# Patient Record
Sex: Female | Born: 1985 | Race: White | Hispanic: No | Marital: Married | Smoking: Current some day smoker
Health system: Western US, Academic
[De-identification: ages and names within clinical notes are randomized; demographics above are authoritative.]

## PROBLEM LIST (undated history)

## (undated) DIAGNOSIS — G8929 Other chronic pain: Secondary | ICD-10-CM

## (undated) DIAGNOSIS — J309 Allergic rhinitis, unspecified: Secondary | ICD-10-CM

## (undated) DIAGNOSIS — F319 Bipolar disorder, unspecified: Secondary | ICD-10-CM

## (undated) DIAGNOSIS — M545 Low back pain, unspecified: Secondary | ICD-10-CM

## (undated) HISTORY — DX: Low back pain, unspecified: M54.50

## (undated) HISTORY — DX: Allergic rhinitis, unspecified: J30.9

## (undated) HISTORY — PX: NO PAST SURGERIES: SHX2092

## (undated) HISTORY — DX: Other chronic pain: G89.29

## (undated) HISTORY — DX: Low back pain: M54.5

## (undated) HISTORY — DX: Bipolar disorder, unspecified (CMS-HCC): F31.9

## (undated) MED ORDER — FLUTICASONE PROPIONATE 50 MCG/ACT NA SUSP
NASAL | 2 refills | Status: AC
Start: 2021-08-29 — End: ?

## (undated) MED ORDER — HUMIRA (2 PEN) 40 MG/0.4ML SC PNKT
PEN_INJECTOR | SUBCUTANEOUS | Status: AC
Start: 2023-02-25 — End: ?

## (undated) MED ORDER — NAPROXEN 375 MG OR TABS
375.00 mg | ORAL_TABLET | Freq: Two times a day (BID) | ORAL | 0 refills | Status: AC
Start: 2020-03-24 — End: ?

## (undated) MED ORDER — NAPROXEN 375 MG OR TABS
375.00 mg | ORAL_TABLET | Freq: Two times a day (BID) | ORAL | 0 refills | Status: AC
Start: 2021-06-17 — End: ?

## (undated) MED ORDER — GABAPENTIN 300 MG OR CAPS
300.0000 mg | ORAL_CAPSULE | Freq: Three times a day (TID) | ORAL | Status: AC | PRN
Start: 2020-10-26 — End: ?

## (undated) MED ORDER — ADALIMUMAB 40 MG/0.4ML SC PNKT (CUSTOM)
40.0000 mg | PEN_INJECTOR | SUBCUTANEOUS | 3 refills | Status: AC
Start: 2022-10-03 — End: ?

## (undated) MED ORDER — NAPROXEN 375 MG OR TABS
375.00 mg | ORAL_TABLET | Freq: Two times a day (BID) | ORAL | 0 refills | Status: AC
Start: 2020-06-14 — End: ?

## (undated) MED ORDER — GABAPENTIN 300 MG OR CAPS
300.00 mg | ORAL_CAPSULE | Freq: Three times a day (TID) | ORAL | 0 refills | Status: AC | PRN
Start: 2020-06-17 — End: ?

## (undated) MED ORDER — GABAPENTIN 300 MG OR CAPS
300.00 mg | ORAL_CAPSULE | Freq: Three times a day (TID) | ORAL | 0 refills | Status: AC
Start: 2020-03-24 — End: ?

## (undated) MED ORDER — NAPROXEN 375 MG OR TABS
ORAL_TABLET | ORAL | 0 refills | Status: AC
Start: 2021-09-20 — End: ?

## (undated) MED ORDER — NAPROXEN 375 MG OR TABS
375.00 mg | ORAL_TABLET | Freq: Two times a day (BID) | ORAL | 0 refills | Status: AC
Start: 2020-06-17 — End: ?

## (undated) MED ORDER — DICLOFENAC SODIUM 75 MG OR TBEC
75.0000 mg | DELAYED_RELEASE_TABLET | Freq: Two times a day (BID) | ORAL | 3 refills | Status: AC
Start: 2022-10-12 — End: ?

## (undated) MED ORDER — NAPROXEN 375 MG OR TABS
375.0000 mg | ORAL_TABLET | Freq: Two times a day (BID) | ORAL | Status: AC
Start: 2020-10-26 — End: ?

---

## 2015-08-09 IMAGING — CR CHEST 2 VWS PA LAT
1 series · 2 of 2 positions shown · non-contrast
Comparison: None

Chest 2 views
HISTORY: Nonspecific reaction to tuberculin skin test without active tuberculosis

[Series 1: view not recorded · 0.17mm/px · 2 of 2 slices shown]
[im 1/2]
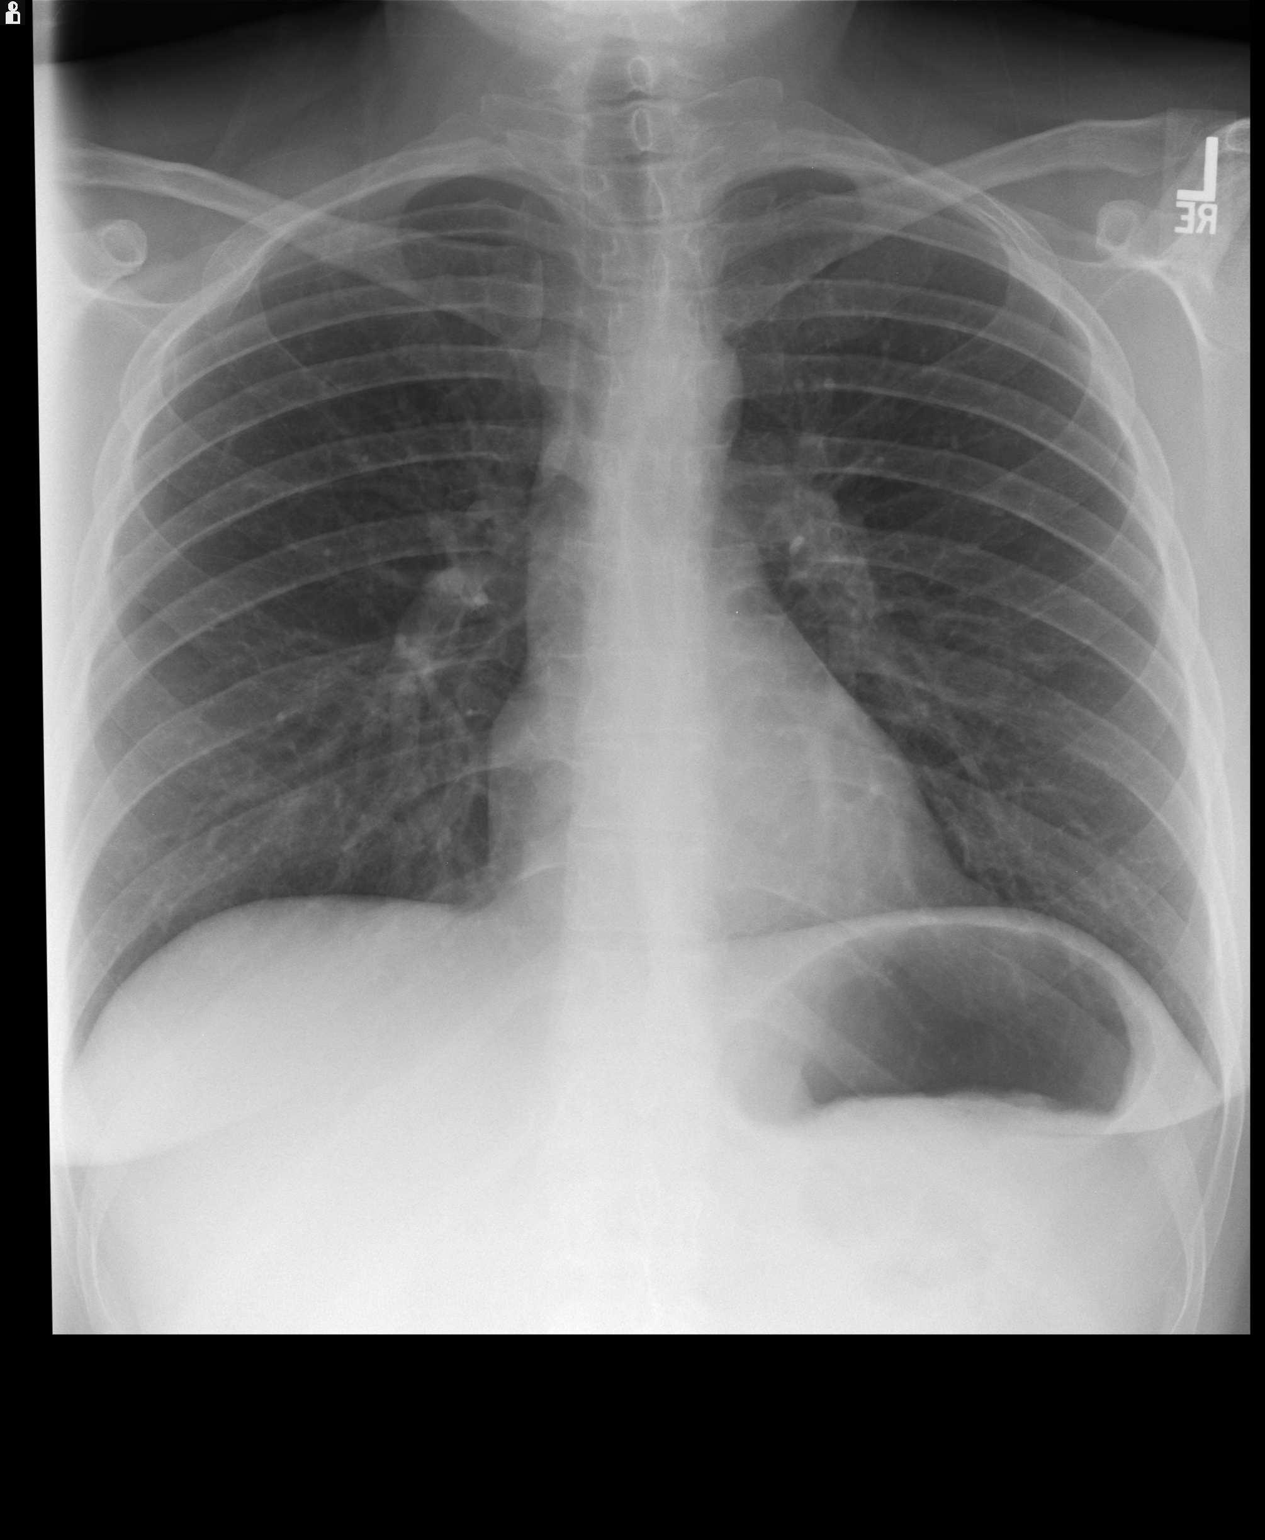
[im 2/2]
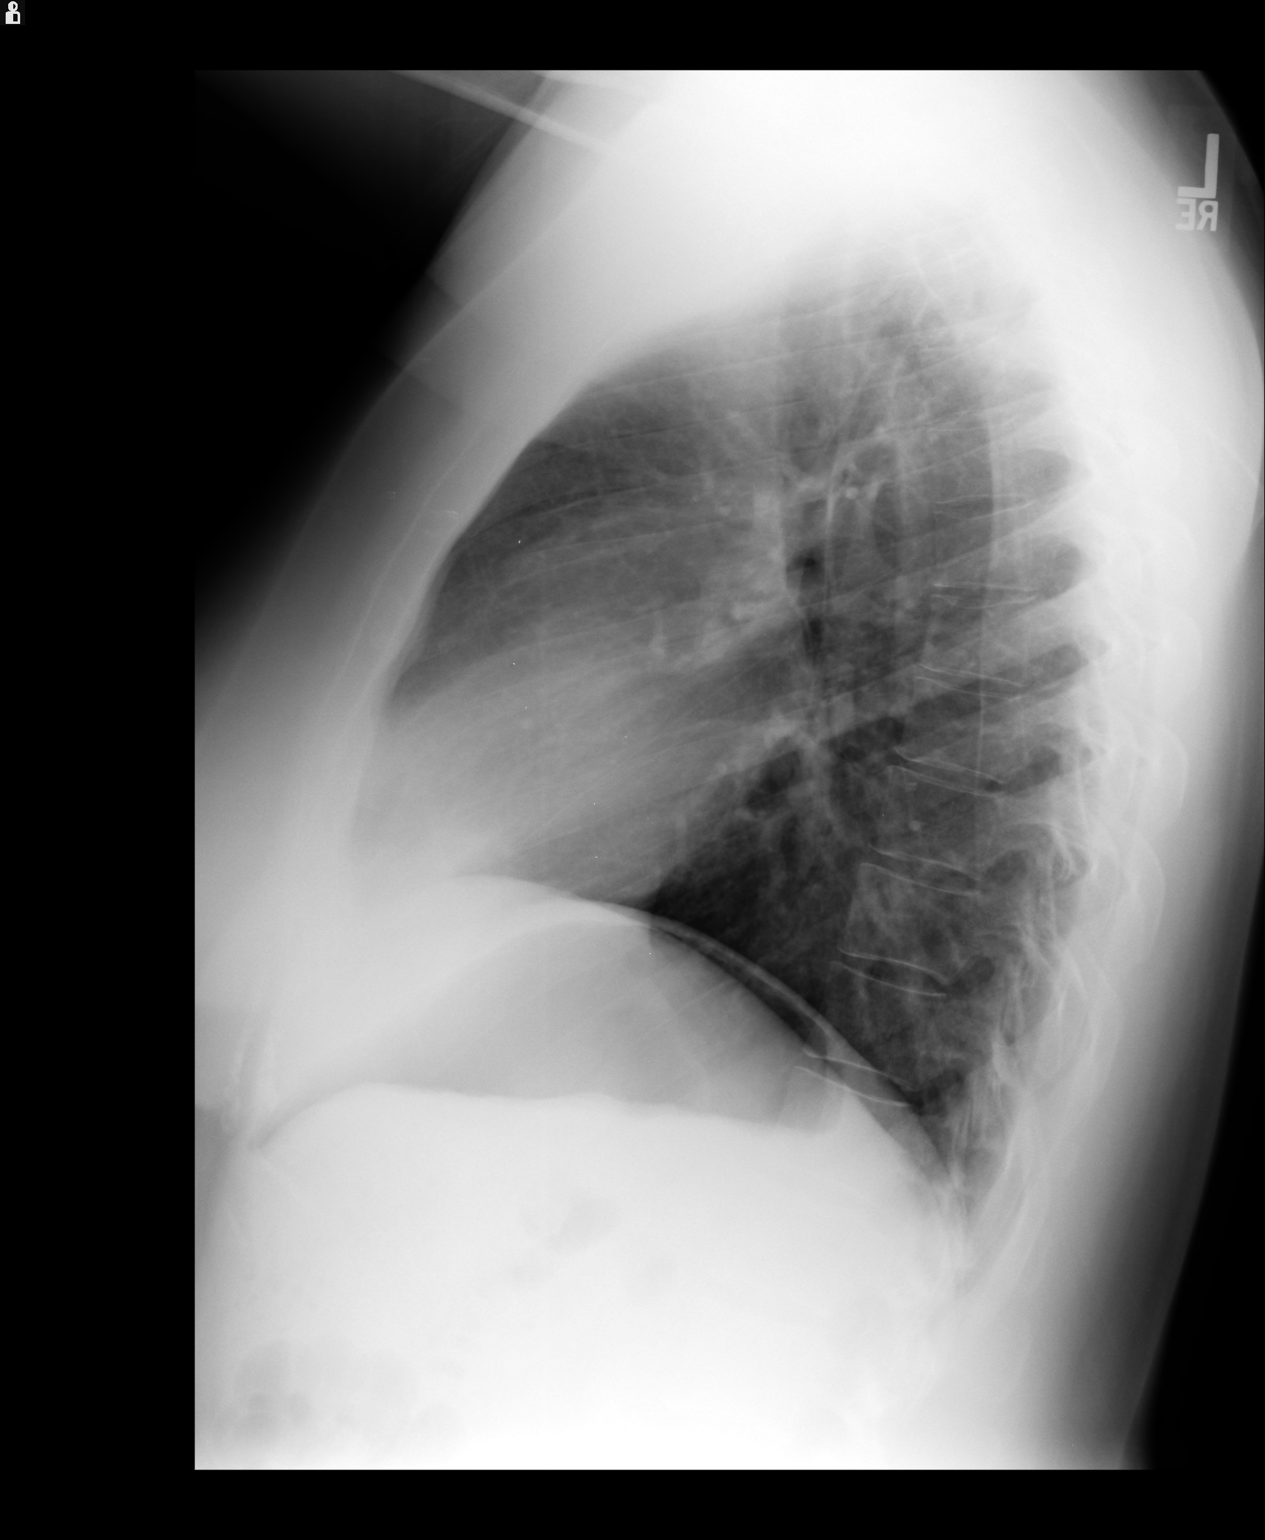

[2 of 2 positions shown; findings below may reference images not displayed]

FINDINGS: Cardiac size and pulmonary vascularity are normal.  The mediastinal silhouette is unremarkable.  The lungs are normal, and there is no pleural fluid.
IMPRESSION: Normal chest.

## 2018-03-26 ENCOUNTER — Encounter: Payer: Self-pay | Admitting: Hospital

## 2018-10-30 ENCOUNTER — Encounter (INDEPENDENT_AMBULATORY_CARE_PROVIDER_SITE_OTHER): Payer: Self-pay | Admitting: Sports Medicine

## 2018-10-30 ENCOUNTER — Encounter

## 2018-10-30 ENCOUNTER — Ambulatory Visit (INDEPENDENT_AMBULATORY_CARE_PROVIDER_SITE_OTHER): Payer: TRICARE Prime—HMO | Admitting: Sports Medicine

## 2018-10-30 VITALS — BP 115/74 | HR 104 | Temp 98.1°F | Wt 177.0 lb

## 2018-10-30 DIAGNOSIS — Z1331 Encounter for screening for depression: Secondary | ICD-10-CM

## 2018-10-30 DIAGNOSIS — F319 Bipolar disorder, unspecified: Secondary | ICD-10-CM

## 2018-10-30 DIAGNOSIS — G8929 Other chronic pain: Secondary | ICD-10-CM | POA: Insufficient documentation

## 2018-10-30 DIAGNOSIS — Z1339 Encounter for screening examination for other mental health and behavioral disorders: Secondary | ICD-10-CM

## 2018-10-30 DIAGNOSIS — M545 Low back pain, unspecified: Secondary | ICD-10-CM

## 2018-10-30 DIAGNOSIS — Z7689 Persons encountering health services in other specified circumstances: Secondary | ICD-10-CM

## 2018-10-30 DIAGNOSIS — Z1389 Encounter for screening for other disorder: Secondary | ICD-10-CM

## 2018-10-30 DIAGNOSIS — J309 Allergic rhinitis, unspecified: Secondary | ICD-10-CM | POA: Insufficient documentation

## 2018-10-30 DIAGNOSIS — Z30011 Encounter for initial prescription of contraceptive pills: Secondary | ICD-10-CM

## 2018-10-30 DIAGNOSIS — F39 Unspecified mood [affective] disorder: Secondary | ICD-10-CM | POA: Insufficient documentation

## 2018-10-30 MED ORDER — MELOXICAM 7.5 MG OR TABS
7.5000 mg | ORAL_TABLET | Freq: Every day | ORAL | 1 refills | Status: DC
Start: 2018-10-30 — End: 2019-01-23

## 2018-10-30 MED ORDER — FLUTICASONE PROPIONATE 50 MCG/ACT NA SUSP
1.0000 | Freq: Every day | NASAL | 3 refills | Status: DC
Start: 2018-10-30 — End: 2020-10-27

## 2018-10-30 MED ORDER — NORETHINDRONE (CONTRACEPTIVE) 0.35 MG OR TABS
1.0000 | ORAL_TABLET | Freq: Every day | ORAL | 3 refills | Status: DC
Start: 2018-10-30 — End: 2018-11-27

## 2018-10-30 NOTE — Progress Notes (Signed)
Family Medicine Clinic Note    Chief Complaint   Patient presents with   . Establish Care     new/allergy medication   . Psychiatric Problem     discuss bipolar medication/pain mgmt was stopped after pregnancy   . Depression     discuss medication/pain mgmt was stopped after pregnancy   . Back Pain     took husbands Mobic because of severe pain but usually Tylenol   . Headaches     SUBJECTIVE: Angelica Wade is a 33 year old female who presents to clinic for:    #Establish care:  Moved here in April from [place]. Husband is in [employer]  Was seen at [facility] for her prenatal care and delivery of her daughter (who is now 21 months old)    #Mood symptoms:  Has been diagnosed with bipolar and depression  Was on Wellbutrin  Stopped taking medications when she was pregnant with her daughter  Previously was seeing a psychiatrist for this  Has been taking medication since she was 49 years old  Mom and multiple family members have bipolar    #Back pain:  Whole back  Feels like it is "on fire"  Sometimes wraps around side, L>R  Worse with walking  Tried PT, chiropractor without relief  Was seeing pain management. Was treating with Mobic and gabapentin 100 mg, which was working for her.     #Contraception:  Previously on depo provera in the past  Regular periods every month  Does have migraines with aura  No history of blood clots    HCM:  Pap: 1 year ago  Flu: declines  Tetanus: 2019    ROS: As per HPI, otherwise  General: Denies fevers, chills, unintentional weight loss  Kidney: Denies changes in urination, blood in urine, pain with urination  Lungs: Denies cough, shortness of breath, bloody cough  Stomach: Denies bloody or black stools, abdominal pain, vomiting, diarrhea  Neurologic: Denies weakness, tingling, problems walking, fainting.  Vascular: + leg pain with walking, Denies painful hands/digits  Oral: Denies bleeding from gums, open sores  Heart: Denies chest pain, irregular heartbeat  Psychiatric: + depression, behavioral  changes  Muscle: Denies joint pain, leg swelling  Ear/Nose/throat: Denies nose bleeds  Skin: Denies rashes  Blood: Denies bleeding, bruising  Endocrine: Denies feeling hot or cold    Current Outpatient Medications   Medication Sig Dispense Refill   . fluticasone propionate (FLONASE) 50 MCG/ACT nasal spray Spray 1 spray into each nostril daily. 1 bottle 3   . meloxicam (MOBIC) 7.5 MG tablet Take 1 tablet (7.5 mg) by mouth daily. 30 tablet 1   . norethindrone (ORTHO MICRONOR) 0.35 MG tablet Take 1 tablet by mouth daily. 84 tablet 3     No current facility-administered medications for this visit.      Allergies   Allergen Reactions   . Shellfish-Derived Products Swelling       Patient Active Problem List   Diagnosis   . Bipolar affective disorder (CMS-HCC)   . Chronic low back pain   . Allergic rhinitis     Past Medical History:   Diagnosis Date   . Allergic rhinitis    . Bipolar affective disorder (CMS-HCC)    . Chronic low back pain      Past Surgical History:   Procedure Laterality Date   . NO PAST SURGERIES       Social History     Tobacco Use   . Smoking status: Current Some  Day Smoker     Types: Cigarettes, Electronic Cigarette (Smokeless)   . Smokeless tobacco: Current User   . Tobacco comment: former cigarettes, vapes currently   Substance Use Topics   . Alcohol use: Yes     Frequency: Monthly or less   . Drug use: Never       OBJECTIVE:  BP 115/74 (BP Location: Left arm, BP Patient Position: Sitting, BP cuff size: Regular)   Pulse 104   Temp 98.1 F (36.7 C) (Oral)   Wt 80.3 kg (177 lb)   LMP 10/16/2018   SpO2 96%  There is no height or weight on file to calculate BMI.    Gen: Alert, no acute distress, non-toxic appearing  Lungs: Clear to auscultation bilaterally. No wheeze/rales/rhonchi   CV: Regular rate & rhythm, normal S1, S2. No murmurs  Back: TTP along mid-thoracic and lower lumbar spine, TTP along paraspinal muscles throughout, L>R.       ASSESSMENT/PLAN:  Lundynn was seen today for establish care,  psychiatric problem, depression, back pain and headaches.    Diagnoses and all orders for this visit:    Encounter to establish care  Health history, medications, allergies reviewed with patient and entered into Epic. ROI signed to obtain records from previous health care.     Bipolar affective disorder, remission status unspecified (CMS-HCC)  Not currently on any medication, has not been on for >1 year. Mood stable currently. Will place referral to psychiatry  -     Psychiatry Clinic    Chronic bilateral low back pain without sciatica  Reports no significant improvement with PT or chiropractor in the past. Will refill her Mobic for now, also discussed acupuncture and OMT as options, patient would like to try OMT  -     meloxicam (MOBIC) 7.5 MG tablet; Take 1 tablet (7.5 mg) by mouth daily.  -     Consult Referral to [facility]    Encounter for initial prescription of contraceptive pills  Reported history of migraine with aura, also smoker, so combined OCPs contraindicated. She is thinking about Nexplanon, but not for sure yet. Does want to start something today, so will give progesterone only pills. Provided with [email or web address] for more information on birth control options. She will let me know if she wants to pursue the Nexplanon or other options.   -     norethindrone (ORTHO MICRONOR) 0.35 MG tablet; Take 1 tablet by mouth daily.    Allergic rhinitis, unspecified seasonality, unspecified trigger  -     fluticasone propionate (FLONASE) 50 MCG/ACT nasal spray; Spray 1 spray into each nostril daily.    Screened negative for alcohol use    Screened negative for drug use    Screened negative for depression      Follow up  Return in about 3 months (around 01/28/2019), or if symptoms worsen or fail to improve.    Medication Review:  Medications reviewed with patient and medication list reconciled.  Over the counter medications, herbal therapies and supplements reviewed.  Patient's understanding and  response to medications assessed.   Barriers to medications assessed and addressed.   Risks, benefits, alternatives to medications reviewed.    No barriers to learning, verbalizes understanding of teaching and instructions.    Junita Push, MD  [facility]  Pager [phone]

## 2018-10-30 NOTE — Interdisciplinary (Signed)
Pre-visit chart review and huddle completed with staff and physician.    Outstanding labs, imaging and consults reviewed and identified.    Health maintanence issues identified and addressed:    Health Maintenance   Topic Date Due   . CERVICAL CANCER SCREENING  [birth year]1987   . PHQ2 depression screen  05/05/2005   . Tetanus (1 - Tdap) 05/05/2006   . INFLUENZA VACCINE  04/03/2018   . Polio Vaccine  Aged Out   . HPV Vaccine <= 26 Yrs  Aged Out   . Meningococcal MCV4 Vaccine  Aged Out

## 2018-10-30 NOTE — Patient Instructions (Signed)
[email or web address] for information about birth control options.

## 2018-11-12 ENCOUNTER — Encounter (INDEPENDENT_AMBULATORY_CARE_PROVIDER_SITE_OTHER): Payer: Self-pay | Admitting: Sports Medicine

## 2018-11-12 ENCOUNTER — Encounter (INDEPENDENT_AMBULATORY_CARE_PROVIDER_SITE_OTHER): Payer: Self-pay | Admitting: Internal Medicine

## 2018-11-12 DIAGNOSIS — Z0000 Encounter for general adult medical examination without abnormal findings: Secondary | ICD-10-CM

## 2018-11-12 DIAGNOSIS — Z30017 Encounter for initial prescription of implantable subdermal contraceptive: Principal | ICD-10-CM

## 2018-11-13 NOTE — Telephone Encounter (Signed)
Please advise 

## 2018-11-13 NOTE — Telephone Encounter (Signed)
From: [name]  To: Laurence Compton, MD  Sent: 11/12/2018 10:53 PM PDT  Subject: 20-Other    Hi so my husband and I have talked about it and we I mean me want to do the arm birth control . Please give me a call at [phone] . Or here not sure how that works .

## 2018-11-17 ENCOUNTER — Encounter (INDEPENDENT_AMBULATORY_CARE_PROVIDER_SITE_OTHER): Payer: Self-pay | Admitting: Sports Medicine

## 2018-11-18 ENCOUNTER — Encounter (INDEPENDENT_AMBULATORY_CARE_PROVIDER_SITE_OTHER): Payer: Self-pay | Admitting: Sports Medicine

## 2018-11-18 NOTE — Telephone Encounter (Signed)
From: [name]  To: Laurence Compton, MD  Sent: 11/17/2018 3:54 PM PDT  Subject: 20-Other    Hi I'm just wondering when I will be getting my pain management . They only gave me 30 pills and that won't last long . I really need my lower back looked at and hip it's been pretty bad lately to the point I can't walk or nothing .

## 2018-11-19 ENCOUNTER — Encounter (INDEPENDENT_AMBULATORY_CARE_PROVIDER_SITE_OTHER): Payer: Self-pay | Admitting: Sports Medicine

## 2018-11-19 ENCOUNTER — Telehealth (INDEPENDENT_AMBULATORY_CARE_PROVIDER_SITE_OTHER): Payer: Self-pay | Admitting: Sports Medicine

## 2018-11-19 NOTE — Telephone Encounter (Signed)
Video visit scheduled for 11/20/2018 @ 10:20.

## 2018-11-19 NOTE — Telephone Encounter (Signed)
Please advise 

## 2018-11-19 NOTE — Telephone Encounter (Signed)
From: [name]  To: Laurence Compton, MD  Sent: 11/19/2018 7:50 AM PDT  Subject: 20-Other    Hi I also wanted to talk to my doctor about smoking and maybe go the alternative way I have not tried like gum or mints or anything I have tried patches but didn't work I have tried talking to someone I have also tried quiting cold [place] nothing has helped . I don't smoke cigarettes but I do vape which is just as bad

## 2018-11-19 NOTE — Telephone Encounter (Signed)
Sounds like she would be great for a video visit. thanks

## 2018-11-20 ENCOUNTER — Encounter (INDEPENDENT_AMBULATORY_CARE_PROVIDER_SITE_OTHER): Payer: Self-pay | Admitting: Sports Medicine

## 2018-11-20 ENCOUNTER — Other Ambulatory Visit: Payer: Self-pay

## 2018-11-20 ENCOUNTER — Telehealth (INDEPENDENT_AMBULATORY_CARE_PROVIDER_SITE_OTHER): Payer: TRICARE Prime—HMO | Admitting: Sports Medicine

## 2018-11-20 DIAGNOSIS — Z720 Tobacco use: Principal | ICD-10-CM

## 2018-11-20 DIAGNOSIS — Z716 Tobacco abuse counseling: Secondary | ICD-10-CM

## 2018-11-20 DIAGNOSIS — G8929 Other chronic pain: Secondary | ICD-10-CM

## 2018-11-20 DIAGNOSIS — M5441 Lumbago with sciatica, right side: Secondary | ICD-10-CM

## 2018-11-20 DIAGNOSIS — M5442 Lumbago with sciatica, left side: Secondary | ICD-10-CM

## 2018-11-20 MED ORDER — VARENICLINE TARTRATE 0.5 MG X 11 & 1 MG X 42 OR MISC
ORAL | 0 refills | Status: DC
Start: 2018-11-20 — End: 2018-11-21

## 2018-11-20 MED ORDER — GABAPENTIN 100 MG OR CAPS
100.0000 mg | ORAL_CAPSULE | Freq: Three times a day (TID) | ORAL | 0 refills | Status: DC | PRN
Start: 2018-11-20 — End: 2018-11-21

## 2018-11-20 NOTE — Patient Instructions (Signed)
For Chantix:  Pick a quit date in 3-4 weeks  Start with 0.5 mg daily for 3 days, then 0.5 mg twice per day for 3 days, then take 1 mg twice daily

## 2018-11-20 NOTE — Progress Notes (Signed)
FAMILY MEDICINE TELEMEDICINE PROGRESS NOTE    CC:    Chief Complaint   Patient presents with   . Nicotine Dependence     SUBJECTIVE:    Angelica Wade is a 33 year old female who is being seen for the following issues:    ---------------------(data below generated by Laurence Compton, MD)--------------------    Patient Verification & Telemedicine Consent:    I am proceeding with this evaluation at the direct request of the patient.  I have verified this is the correct patient and have obtained verbal consent and written consent from the patient/ surrogate to perform this voluntary telemedicine evaluation (including obtaining history, performing examination and reviewing data provided by the patient).   The patient/ surrogate has the right to refuse this evaluation.  I have explained risks (including potential loss of confidentiality), benefits, alternatives, and the potential need for subsequent face to face care. Patient/ surrogate understands that there is a risk of medical inaccuracies given that our recommendations will be made based on reported data (and we must therefore assume this information is accurate).  Knowing that there is a risk that this information is not reported accurately, and that the telemedicine video, audio, or data feed may be incomplete, the patient agrees to proceed with evaluation and holds [place] harmless knowing these risks. In this evaluation, we will be providing recommendations only.  The ultimate decision to follow, or not follow, these recommendations will be left to the bedside treating/ requesting practitioner.  The patient/ surrogate has been notified that other healthcare professionals (including students, residents and Engineer, maintenance) may be involved in this audio-video evaluation.   All laws concerning confidentiality and patient access to medical records and copies of medical records apply to telemedicine.  The patient/ surrogate has received the [place] Notice of Privacy  Practices.  I have reviewed this above verification and consent paragraph with the patient/ surrogate.  If the patient is not capacitated to understand the above, and no surrogate is available, since this is not an emergency evaluation, the visit will be rescheduled until such time that the patient can consent, or the surrogate is available to consent.    Demographics:   Medical Record #: [id number]   Date: November 20, 2018   Patient Name: Angelica Wade   DOB: April 12, 1986  Age: 33 year old  Sex: female  Location: Home address on file      Evaluator(s):   Angelica Wade was evaluated by me today.    Clinic Location:  [facility]  [facility]  [address]  [place] [address]    20 minutes of what became a 20 minute appointment was spent face to face with patient/cargiver in coordinating care and counseling for the below issues.      HPI by Problem:     1) Smoking cessation:  Vaping currently, no cigarettes  Has tried Chantix before, did not have success with this  Has also tried patches, has not tried gum or lozenges  Also done meditation  Tried cold [place], but made things worse  Interested in other options    2) Back pain:  Radiates into both hips  Feels like sciatic nerve  Hurts with any ambulation  Has been taking Mobic, but doesn't feel like it has been working as well for her  Previously on gabapentin in the past, thinks 100 mg, thinks this did work well for her in the past  Per last  clinic note:  #Back pain:  Whole back  Feels like it is "on fire"  Sometimes wraps around side, L>R  Worse with walking  Tried PT, chiropractor without relief  Was seeing pain management. Was treating with Mobic and gabapentin 100 mg, which was working for her.       Review of Systems:   As per HPI, otherwise denies saddle anesthesia, bowel/bladder issues, numbness, tingling, weakness, fevers, chills, cough, shortness of breath      Patient Active Problem List   Diagnosis   .  Bipolar affective disorder (CMS-HCC)   . Chronic low back pain   . Allergic rhinitis       Outpatient Medications Prior to Visit   Medication Sig Dispense Refill   . fluticasone propionate (FLONASE) 50 MCG/ACT nasal spray Spray 1 spray into each nostril daily. 1 bottle 3   . meloxicam (MOBIC) 7.5 MG tablet Take 1 tablet (7.5 mg) by mouth daily. 30 tablet 1   . norethindrone (ORTHO MICRONOR) 0.35 MG tablet Take 1 tablet by mouth daily. 84 tablet 3     No facility-administered medications prior to visit.        OBJECTIVE:  Physical Exam:   GENERAL: NAD, comfortable, WDWN  RESP:  Unlabored breathing  PSYCH: normal affect  NEURO:  alert & oriented  SKIN: Intact, no rashes or erythema, no open wounds       ASSESSMENT & PLAN:  Easton Gales is a 33 year old female was seen today for:  Fonnie was seen today for nicotine dependence.    Diagnoses and all orders for this visit:    Tobacco use  -     Smoker's Cessation - Quit Line  -     varenicline (CHANTIX STARTER PAK) 0.5 MG X 11 & 1 MG X 42 tablet; Take one 0.5mg  tablet by mouth once daily for 3 days, then one 0.5mg  tab twice daily for 4 days, then one 1mg  tab twice daily    Encounter for smoking cessation counseling  Has tried multiple options for quitting in the past, including patches, meditation, and Chantix.  She does report the Chantix was not successful, but reports trying to quit smoking at the same time as she started her Chantix medication and only took this for a couple of weeks.  Discuss further options with patient for smoking cessation, including trying the Chantix again with proper treatment protocol verses nicotine gum verses Wellbutrin versus other options.  She is interested in trying Chantix again.  Recommend she pick a quit date in 3-4 weeks to allow the medication to build up in her system and reach a steady state prior to attempting to quit smoking.  Also discussed that this can take three months or longer of treatment with Chantix to have its most  beneficial effect.  -     Smoker's Cessation - Quit Line  -     varenicline (CHANTIX STARTER PAK) 0.5 MG X 11 & 1 MG X 42 tablet; Take one 0.5mg  tablet by mouth once daily for 3 days, then one 0.5mg  tab twice daily for 4 days, then one 1mg  tab twice daily    Chronic bilateral low back pain with bilateral sciatica  Has had benefit with gabapentin in the past, will restart gabapentin 100 mg t.i.d. p.r.n. for back pain.  Will also place referral to pain clinic at patient's request for further management of her chronic low back pain.  -     Pain Clinic  -  gabapentin (NEURONTIN) 100 MG capsule; Take 1 capsule (100 mg) by mouth 3 times daily as needed (Back pain).      Health Maintenance   Topic Date Due   . IMM pneumococcal 19-64 Medium Risk PPSV23 only (1 of 1 - PPSV23) 05/05/2006   . PHQ2 depression screen  10/31/2019   . CERVICAL CANCER SCREENING  09/03/2022   . Tetanus (3 - Td) 03/03/2028   . INFLUENZA VACCINE  Addressed   . Polio Vaccine  Aged Out   . HPV Vaccine <= 26 Yrs  Aged Out   . Meningococcal MCV4 Vaccine  Aged Out       Return if symptoms worsen or fail to improve.    Patient Instructions   For Chantix:  Pick a quit date in 3-4 weeks  Start with 0.5 mg daily for 3 days, then 0.5 mg twice per day for 3 days, then take 1 mg twice daily      Laurence Compton, MD      Plan discussed with pt including risks/benefits/alternatives including watchful waiting.  Informed pt of 24/7 on call MD.  ED if acutely worsening after hours.  Pt verbalized understanding.    Medications reviewed with patient and medication list reconciled.  Over the counter medications, herbal therapies and supplements reviewed.  Patient's understanding and response to medications assessed.    Barriers to medications assessed and addressed.  Risks, benefits, alternatives to medications reviewed.

## 2018-11-20 NOTE — Telephone Encounter (Signed)
done

## 2018-11-21 ENCOUNTER — Encounter (INDEPENDENT_AMBULATORY_CARE_PROVIDER_SITE_OTHER): Payer: Self-pay | Admitting: Sports Medicine

## 2018-11-21 DIAGNOSIS — G8929 Other chronic pain: Secondary | ICD-10-CM

## 2018-11-21 DIAGNOSIS — Z716 Tobacco abuse counseling: Secondary | ICD-10-CM

## 2018-11-21 DIAGNOSIS — Z720 Tobacco use: Secondary | ICD-10-CM

## 2018-11-21 MED ORDER — GABAPENTIN 100 MG OR CAPS
100.0000 mg | ORAL_CAPSULE | Freq: Three times a day (TID) | ORAL | 0 refills | Status: DC | PRN
Start: 2018-11-21 — End: 2020-02-18

## 2018-11-21 MED ORDER — VARENICLINE TARTRATE 0.5 MG X 11 & 1 MG X 42 OR MISC
ORAL | 0 refills | Status: DC
Start: 2018-11-21 — End: 2019-10-08

## 2018-11-21 NOTE — Telephone Encounter (Signed)
Spoke to patient in which she verbalized understanding and will pick up asap. She appreciated the urgent response.

## 2018-11-21 NOTE — Telephone Encounter (Signed)
Please advise 

## 2018-11-21 NOTE — Telephone Encounter (Signed)
General Inquiry     Who is calling: Incoming call from patient    Reason for this call: Patient calling in regarding prescriptions for varenicline (CHANTIX STARTER PAK) 0.5 MG X 11 & 1 MG X 42 tablet and gabapentin (NEURONTIN) 100 MG capsule. Patient called in due to spouse going in to pick up medication and the pharmacy stated that it was never sent.    Agent called into the pharmacy at [phone] and spoke with the pharmacist who states that they never received prescription, but that have been having issues with their electronic faxes. Pharmacist states that they are not able to take verbals and patient will need a hard copy prescription. Patient requesting ASAP.      Action required by office: Please expedite request and Please contact caller     Duplicate encounter? No previous documentation found on this issue.     Best way to contact: [phone]  Alternative: n/a    Inquiry has been read verbatim to this caller. Verbalizes satisfaction and confirms the above is accurate: yes    Has been advised this message will be transmitted to office and can expect a response within the next 24-72 hours.    Encounter created by Care Assist MA.

## 2018-11-24 NOTE — Progress Notes (Deleted)
Outpatient Psychiatry Intake Note  Patient Access Team-Based Health [phone])     Patient Name: Angelica Wade  Age: 33 year old  Date of Service: 12/02/2018    The patient was informed about confidentiality and limits to confidentiality within a therapeutic relationship. The patient acknowledged understanding of this information and consented to the session.    Referral Source: Patient was referred to General Psychiatry services by Dr. Donzetta Matters for evaluation and management of bipolar affective disorder.    Sources of Information: Patient, patient's chart.    Chief Complaint: No chief complaint on file.    History of Presenting Problems: ***  Per referring note, patient has prior diagnosis of Bipolar Disorder, which psychiatric medication management of symptoms since age 25, though d/ced her Wellbutrin in the context of pregnancy with her daughter. Patient gave birth ***. Patient was followed by psychiatrist prior to moving to [facility] in April.      Psychiatric Symptoms Review:  Sleep: {Sleep:21587}  Anxiety: {Anxiety:21588}  Mood: {Mood:21589}  Current SI/HI: {Current SI/HI:21590}  Mania: {PNSQZ:83462}  PTSD: {TVIF:12527}  OCD: {HSJ:29090}  AH/VH: {AH/VH:21597}    Medical Systems Review:  Endorsed: {MedSystemsRev:21598}    Denied: {MedSystemsRev:21598}    Suicide Risk Assessment: Comprehensive suicide risk assessment was performed and patient was assessed to be {LOW2:12345} acute risk for self harm. Modifiable risk factors include {Modifiable Risk Factors:21599}. Non-modifiable risk factors include {Non-Modifiable Risk Factors:21600}. The patient also has protective factors of {Protective Factors:21601}.    Assessment Results:  No flowsheet data found.   No flowsheet data found.    Psychiatric History:     Past Diagnoses: ***  Past Self-Harm: ***  Past Suicidal Ideation: ***  Past Suicide Attempts: ***  Past Psychiatric Hospitalizations: ***  Outpatient Psychological Treatment: ***   Outpatient Psychiatric  Treatment: ***  Psychiatric Medication Trials:    Previous Medications: ***   Current Medications: ***  Psychological Trauma History: ***  Family Psychiatric History: ***    Substance Use/Treatment History:  Tobacco: *** vaping, no cigarettes; recently prescribed Chantix  Alcohol: ***    Cannabis: ***  Illicit Substances: ***  Caffeine: ***  Prescription medications: ***  History of Problematic Use: ***  Treatment History: ***  Family History: ***    Medical History:  Falls: ***  LOC/Concussion: ***  Exercise: ***  Patient Active Problem List   Diagnosis   . Bipolar affective disorder (CMS-HCC)   . Chronic low back pain   . Allergic rhinitis     Past Medical History:   Diagnosis Date   . Allergic rhinitis    . Bipolar affective disorder (CMS-HCC)    . Chronic low back pain      Current Outpatient Medications   Medication Sig   . fluticasone propionate (FLONASE) 50 MCG/ACT nasal spray Spray 1 spray into each nostril daily.   Marland Kitchen gabapentin (NEURONTIN) 100 MG capsule Take 1 capsule (100 mg) by mouth 3 times daily as needed (Back pain).   . meloxicam (MOBIC) 7.5 MG tablet Take 1 tablet (7.5 mg) by mouth daily.   . norethindrone (ORTHO MICRONOR) 0.35 MG tablet Take 1 tablet by mouth daily.   . varenicline (CHANTIX STARTER PAK) 0.5 MG X 11 & 1 MG X 42 tablet Take one 0.5mg  tablet by mouth once daily for 3 days, then one 0.5mg  tab twice daily for 4 days, then one 1mg  tab twice daily     No current facility-administered medications for this visit.      Past Surgical History:  Procedure Laterality Date   . NO PAST SURGERIES       Allergies   Allergen Reactions   . Shellfish-Derived Products Swelling     Family History   Problem Relation Name Age of Onset   . Bipolar Disorder Mother     . Diabetes Mother     . Heart Disease Mother     . Alcohol/Drug Mother     . Breast Cancer Mother         Social History:  Ethnicity: ***  Born/Raised: ***  Family of Origin: ***  History of Childhood Abuse:  ***  Education/Occupational/Financial: ***  Current Relationship Status: *** Husband is in the [employer]  Sexual Orientation: ***  Gender Identity/Preferred Pronouns: ***  Children: ***  Support System/Resources: ***  Living Arrangement: ***   Hobbies/Interests: ***  Spirituality: ***    Mental Status Exam:   Physical appearance: {MDPhysical:21602::"well-nourished","appropriate appearance","grooming"}  Relatedness: {Psych relatedness:15747}  Eye contact: {Psych eye contact:15748}  Attitude: {Psych attitude:15749}  Speech quality: {Psych speech quality:15750}  Motor behavior: {Psych motor behavior:15751}  Mood: {Psych mood:15752}  Affect: {MDPsych Affect:23632}  Thought process: {Psych thought process:15754}  Thought content: {Psych thought content:15755}  Suicidal ideation: {MDSI:23633}  Homicidal ideation: {MDSI:23633}  Oriented to: {orient:21603::"person","place","time"}  Sensorium: {Psych sensorium:15760}  Attention/Concentration: {MDPsych Attn:23631}  Memory: {MDPsych Attn:23631}  Insight: {Psych insight/judgement:15764}  Judgment: {Psych insight/judgement:15764}    Assessment: ***    Patient Treatment Goals: ***    Plan/Recommendations:  ***    *** Permanent note:  Case is closed with provider. Please obtain permission from Medical Director or Program Director prior to scheduling this patient for an intake or follow up appointment with this provider.    Molli Barrows, Ph.D., Licensed Clinical Psychologist

## 2018-11-26 ENCOUNTER — Encounter (INDEPENDENT_AMBULATORY_CARE_PROVIDER_SITE_OTHER): Payer: Self-pay | Admitting: Hospital

## 2018-11-26 ENCOUNTER — Telehealth (INDEPENDENT_AMBULATORY_CARE_PROVIDER_SITE_OTHER): Payer: Self-pay

## 2018-11-26 ENCOUNTER — Telehealth (INDEPENDENT_AMBULATORY_CARE_PROVIDER_SITE_OTHER): Payer: Self-pay | Admitting: Sports Medicine

## 2018-11-26 ENCOUNTER — Encounter (INDEPENDENT_AMBULATORY_CARE_PROVIDER_SITE_OTHER): Payer: Self-pay | Admitting: Sports Medicine

## 2018-11-26 DIAGNOSIS — Z30011 Encounter for initial prescription of contraceptive pills: Principal | ICD-10-CM

## 2018-11-26 NOTE — Telephone Encounter (Signed)
From: [name]  To: Laurence Compton, MD  Sent: 11/26/2018 12:29 PM PDT  Subject: 20-Other    Hi just wondering if you got the birth control I requested in yet .

## 2018-11-26 NOTE — Telephone Encounter (Signed)
Approved but not bring patients in for procedures.   Please advise

## 2018-11-26 NOTE — Telephone Encounter (Signed)
If patient calls to  or confirm their appointment:    1. Do you have or have you had in the last 24 hours: :   Fever:  N   New cough (not chronic) : N      Shortness of breath:N  *Refer patient to PCP if YES for any flu like symptoms    2. Have you knowingly been exposed to anyone having any, some or all of the symptoms listed above?  N    Have you traveled outside of the [place] in the last 14 days? .  N    o If yes to any of these, schedule and or reschedule existing appointment 21 + days out and established patients route to:  1. [place]: P GIF ADMINISTRATION + Provider  2. [place]: P L[facility] FRONT DESK + Provider  New Patients: Document and do not route.      If no to all questions, document and close encounter.  Do not route.    Scripting if patient schedules, reschedules or keeps appointment:   [facility] takes health risks associated with respiratory illnesses, including influenza and the novel coronavirus, very seriously. As such, we are implementing strict visitor restrictions. No visitors of any age will be permitted to visit our hospitals or clinics until further notice. Request for exceptions may be made by contacting the clinic or unit in which loved ones are receiving care.

## 2018-11-26 NOTE — Telephone Encounter (Signed)
Will forward to dr galloway  Due to COVID unsure if Nexplaon is and essential appointment  Will wait his advise

## 2018-11-26 NOTE — Telephone Encounter (Signed)
Has it been ordered and in stock? Please make sure we have this in stock and then let patient know that we will have her scheduled for procedure to place it as soon as we are allowed to have patients come in for non-emergent procedures. Recommend she continue her pills for now. Thanks

## 2018-11-26 NOTE — Telephone Encounter (Signed)
Who is calling: Incoming call from patient  Insurance Coverage Verified: Active- in network  Reason for this call: Pt called requesting status on the order placed for pt's Nexplanon. Pt would like a call back from nurse or PCP regarding her birth control. Please advise      Action required by office: Please contact caller    Duplicate encounter? No previous documentation found on this issue.     Best way to contact: [phone]  Alternative: [phone]    Inquiry has been read verbatim to this caller. Verbalizes satisfaction and confirms the above is accurate: yes      Has been advised this message will be transmitted to office and can expect a response within the next 24-72 hours.

## 2018-11-27 MED ORDER — NORETHINDRONE (CONTRACEPTIVE) 0.35 MG OR TABS
1.0000 | ORAL_TABLET | Freq: Every day | ORAL | 3 refills | Status: DC
Start: 2018-11-27 — End: 2019-02-09

## 2018-11-27 NOTE — Telephone Encounter (Signed)
We have in stock , I will call her. Pt informed and will call back in 3 weeks to reschedule. Pills sent of refills

## 2018-12-01 ENCOUNTER — Telehealth (INDEPENDENT_AMBULATORY_CARE_PROVIDER_SITE_OTHER): Payer: Self-pay | Admitting: Clinical

## 2018-12-01 NOTE — Telephone Encounter (Signed)
TELEPHONE NOTE    Note entered by Molli Barrows, Ph.D., Licensed Clinical Psychologist    Left VM for patient requesting call back for best days/times to reach her to do pre-screening prior to her upcoming psychiatry intake appointment, which will be used to determine if it is appropriate to convert to video visit. Provided clinic number in VM.
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
## 2018-12-02 ENCOUNTER — Encounter (INDEPENDENT_AMBULATORY_CARE_PROVIDER_SITE_OTHER): Payer: TRICARE Prime—HMO | Admitting: Clinical

## 2018-12-02 NOTE — Telephone Encounter (Signed)
Patient returned your call, she will be available all day today 12/02/18.

## 2018-12-02 NOTE — Telephone Encounter (Signed)
TELEPHONE NOTE    Note entered by Molli Barrows, Ph.D., Licensed Clinical Psychologist    Spoke with patient and conducted pre-screening. Pt appears appropriate for video visit and has already completed such an appointment with another provider in [place]. Pt has been converted

## 2018-12-03 ENCOUNTER — Encounter (INDEPENDENT_AMBULATORY_CARE_PROVIDER_SITE_OTHER): Payer: Self-pay | Admitting: Clinical

## 2018-12-16 ENCOUNTER — Encounter (INDEPENDENT_AMBULATORY_CARE_PROVIDER_SITE_OTHER): Payer: Self-pay | Admitting: Clinical

## 2018-12-16 ENCOUNTER — Telehealth (INDEPENDENT_AMBULATORY_CARE_PROVIDER_SITE_OTHER): Payer: TRICARE Prime—HMO | Admitting: Clinical

## 2018-12-16 DIAGNOSIS — F5105 Insomnia due to other mental disorder: Secondary | ICD-10-CM

## 2018-12-16 DIAGNOSIS — F32A Depression, unspecified: Secondary | ICD-10-CM

## 2018-12-16 DIAGNOSIS — R41840 Attention and concentration deficit: Secondary | ICD-10-CM

## 2018-12-16 DIAGNOSIS — F329 Major depressive disorder, single episode, unspecified: Secondary | ICD-10-CM

## 2018-12-16 DIAGNOSIS — F419 Anxiety disorder, unspecified: Secondary | ICD-10-CM

## 2018-12-17 ENCOUNTER — Telehealth (INDEPENDENT_AMBULATORY_CARE_PROVIDER_SITE_OTHER): Payer: TRICARE Prime—HMO | Admitting: Nurse Practitioner

## 2018-12-17 DIAGNOSIS — F419 Anxiety disorder, unspecified: Secondary | ICD-10-CM | POA: Insufficient documentation

## 2018-12-17 DIAGNOSIS — F39 Unspecified mood [affective] disorder: Secondary | ICD-10-CM

## 2018-12-17 DIAGNOSIS — G4700 Insomnia, unspecified: Secondary | ICD-10-CM

## 2018-12-17 MED ORDER — TRAZODONE HCL 50 MG OR TABS
ORAL_TABLET | ORAL | 1 refills | Status: DC
Start: 2018-12-17 — End: 2020-02-18

## 2018-12-17 MED ORDER — LAMOTRIGINE 25 MG OR TABS
25.00 mg | ORAL_TABLET | Freq: Every day | ORAL | 0 refills | Status: DC
Start: 2018-12-17 — End: 2020-02-18
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
## 2018-12-30 LAB — SMOKING OR VAPING CESSATION - KICK IT ~~LOC~~

## 2019-01-23 ENCOUNTER — Encounter (INDEPENDENT_AMBULATORY_CARE_PROVIDER_SITE_OTHER): Payer: Self-pay | Admitting: Sports Medicine

## 2019-01-23 DIAGNOSIS — G8929 Other chronic pain: Secondary | ICD-10-CM

## 2019-01-23 DIAGNOSIS — M5442 Lumbago with sciatica, left side: Secondary | ICD-10-CM

## 2019-01-23 MED ORDER — MELOXICAM 15 MG OR TABS
15.0000 mg | ORAL_TABLET | Freq: Every day | ORAL | 0 refills | Status: DC
Start: 2019-01-23 — End: 2019-01-23

## 2019-01-23 MED ORDER — MELOXICAM 15 MG OR TABS
15.0000 mg | ORAL_TABLET | Freq: Every day | ORAL | 0 refills | Status: DC
Start: 2019-01-23 — End: 2019-01-24

## 2019-01-23 NOTE — Telephone Encounter (Signed)
From: Angelica Wade  To: Laurence Compton, MD  Sent: 01/23/2019 4:47 PM PDT  Subject: 1-Non Urgent Medical Advice    Hi I'm here and they are saying it didn't go through     ----- Message -----  From: Laurence Compton, MD  Sent: 01/23/19, 3:21 PM  To: Angelica Wade  Subject: RE: 1-Non Urgent Medical Advice    Sure thing, sorry about that. Scheduling information is below:    You have been referred to the Department of Pain for a clinic consult. Please call the Pain Clinic to schedule your clinic appointment in 5 to 10 days once the authorization has been completed.     Pain Department scheduling phone number: [phone]. We look forward to serving your Pain needs.    ----- Message -----   From: Angelica Wade   Sent: 01/23/2019 3:17 PM PDT   To: Laurence Compton, MD  Subject: 1-Non Urgent Medical Advice    Okay thank you do you happen to have the number for that pain clinic. I'm sorry I never got a call for pain clinic so I didn't know that the referral went through .     ----- Message -----  From: Laurence Compton, MD  Sent: 01/23/19, 3:14 PM  To: Angelica Wade  Subject: RE: 1-Non Urgent Medical Advice    [name],  I have refilled and given you some extra Mobic to have on hand, but the max dose recommended for this is 15 mg daily, which sounds like you are taking way over that. Please do not exceed 15 mg daily of this or you will run into side effects. If your pain is not well-controlled, then please call and schedule an appointment with pain clinic as I referred you to at last visit. Or you can schedule a follow up with me to [facility]dress.  Dr. Donzetta Matters    ----- Message -----   From: Angelica Wade   Sent: 01/23/2019 1:16 PM PDT    To: Laurence Compton, MD  Subject: 1-Non Urgent Medical Advice    Hi I was wondering if you could refill my mobic and up my dosage too 15 ml. I had left over from my pain management and was taking 30 ml two times a day morning and night time and it helps so much I don't find myself  hurting when I do dishes or do the laundry and at night time I don't find myself waking up and staying up because of the pain .     So if I could do 2 15 ml which is 30 ml two times a day it would help out . Thank you and have a blessed day and stay safe .

## 2019-01-23 NOTE — Telephone Encounter (Signed)
From: [name]  To: Laurence Compton, MD  Sent: 01/23/2019 1:16 PM PDT  Subject: 1-Non Urgent Medical Advice    Hi I was wondering if you could refill my mobic and up my dosage too 15 ml. I had left over from my pain management and was taking 30 ml two times a day morning and night time and it helps so much I don't find myself hurting when I do dishes or do the laundry and at night time I don't find myself waking up and staying up because of the pain .     So if I could do 2 15 ml which is 30 ml two times a day it would help out . Thank you and have a blessed day and stay safe .

## 2019-01-23 NOTE — Telephone Encounter (Signed)
From: Angelica Wade  To: Angelica Compton, MD  Sent: 01/23/2019 3:17 PM PDT  Subject: 1-Non Urgent Medical Advice    Okay thank you do you happen to have the number for that pain clinic. I'm sorry I never got a call for pain clinic so I didn't know that the referral went through .     ----- Message -----  From: Angelica Compton, MD  Sent: 01/23/19, 3:14 PM  To: Angelica Wade  Subject: RE: 1-Non Urgent Medical Advice    Angelica Wade,  I have refilled and given you some extra Mobic to have on hand, but the max dose recommended for this is 15 mg daily, which sounds like you are taking way over that. Please do not exceed 15 mg daily of this or you will run into side effects. If your pain is not well-controlled, then please call and schedule an appointment with pain clinic as I referred you to at last visit. Or you can schedule a follow up with me to [facility]dress.  Angelica Wade    ----- Message -----   From: Angelica Wade   Sent: 01/23/2019 1:16 PM PDT   To: Angelica Compton, MD  Subject: 1-Non Urgent Medical Advice    Hi I was wondering if you could refill my mobic and up my dosage too 15 ml. I had left over from my pain management and was taking 30 ml two times a day morning and night time and it helps so much I don't find myself hurting when I do dishes or do the laundry and at night time I don't find myself waking up and staying up because of the pain .     So if I could do 2 15 ml which is 30 ml two times a day it would help out . Thank you and have a blessed day and stay safe .

## 2019-01-24 MED ORDER — MELOXICAM 15 MG OR TABS
15.0000 mg | ORAL_TABLET | Freq: Every day | ORAL | 0 refills | Status: DC
Start: 2019-01-24 — End: 2020-01-26

## 2019-01-24 NOTE — Telephone Encounter (Signed)
From: [name]  To: Laurence Compton, MD  Sent: 01/23/2019 5:26 PM PDT  Subject: 1-Non Urgent Medical Advice    Hi is there anyway you could send it to the [facility]   ----- Message -----  From: Laurence Compton, MD  Sent: 01/23/19, 3:21 PM  To: [name]  Subject: RE: 1-Non Urgent Medical Advice    Sure thing, sorry about that. Scheduling information is below:    You have been referred to the Department of Pain for a clinic consult. Please call the Pain Clinic to schedule your clinic appointment in 5 to 10 days once the authorization has been completed.     Pain Department scheduling phone number: [phone]. We look forward to serving your Pain needs.    ----- Message -----   From: [name]   Sent: 01/23/2019 3:17 PM PDT   To: Laurence Compton, MD  Subject: 1-Non Urgent Medical Advice    Okay thank you do you happen to have the number for that pain clinic. I'm sorry I never got a call for pain clinic so I didn't know that the referral went through .     ----- Message -----  From: Laurence Compton, MD  Sent: 01/23/19, 3:14 PM  To: [name]  Subject: RE: 1-Non Urgent Medical Advice    [name],  I have refilled and given you some extra Mobic to have on hand, but the max dose recommended for this is 15 mg daily, which sounds like you are taking way over that. Please do not exceed 15 mg daily of this or you will run into side effects. If your pain is not well-controlled, then please call and schedule an appointment with pain clinic as I referred you to at last visit. Or you can schedule a follow up with me to [facility]dress.  Dr. Donzetta Matters    ----- Message -----   From: [name]   Sent: 01/23/2019 1:16 PM PDT   To: Laurence Compton, MD  Subject: 1-Non Urgent Medical Advice    Hi I was wondering if you could refill my mobic and up my dosage too 15 ml. I had left over from my pain management and was taking 30 ml two times a day morning and night time and it helps so much I don't  find myself hurting when I do dishes or do the laundry and at night time I don't find myself waking up and staying up because of the pain .     So if I could do 2 15 ml which is 30 ml two times a day it would help out . Thank you and have a blessed day and stay safe .

## 2019-01-30 ENCOUNTER — Encounter (INDEPENDENT_AMBULATORY_CARE_PROVIDER_SITE_OTHER): Payer: Self-pay | Admitting: Sports Medicine

## 2019-01-30 ENCOUNTER — Telehealth (INDEPENDENT_AMBULATORY_CARE_PROVIDER_SITE_OTHER): Payer: Self-pay | Admitting: Sports Medicine

## 2019-01-30 NOTE — Telephone Encounter (Signed)
Who is calling: Incoming call from patient  Insurance Coverage Verified: Active- in network  Reason for this call: patient calling to schedule a procedure for her birth control Nexplanon    Action required by office: Please contact caller    Duplicate encounter? No previous documentation found on this issue.     Best way to contact: [phone]    Inquiry has been read verbatim to this caller. Verbalizes satisfaction and confirms the above is accurate: yes      Has been advised this message will be transmitted to office and can expect a response within the next 24-72 hours.

## 2019-01-30 NOTE — Telephone Encounter (Signed)
Message forward to front office and Dr, Donzetta Matters for scheduling

## 2019-01-30 NOTE — Telephone Encounter (Signed)
From: [name]  To: Laurence Compton, MD  Sent: 01/30/2019 3:30 PM PDT  Subject: 1-Non Urgent Medical Advice    Hi me again I was just wondering when we could make an appointment for my birth control the Nexplanon the arm birth control.

## 2019-02-02 NOTE — Telephone Encounter (Signed)
Placed call to patient and scheduled for [facility] 06/8

## 2019-02-09 ENCOUNTER — Encounter (INDEPENDENT_AMBULATORY_CARE_PROVIDER_SITE_OTHER): Payer: Self-pay | Admitting: Sports Medicine

## 2019-02-09 ENCOUNTER — Ambulatory Visit (INDEPENDENT_AMBULATORY_CARE_PROVIDER_SITE_OTHER): Payer: TRICARE Prime—HMO | Admitting: Sports Medicine

## 2019-02-09 VITALS — BP 114/78 | HR 83 | Temp 98.6°F | Wt 187.0 lb

## 2019-02-09 DIAGNOSIS — Z975 Presence of (intrauterine) contraceptive device: Secondary | ICD-10-CM | POA: Insufficient documentation

## 2019-02-09 DIAGNOSIS — Z30017 Encounter for initial prescription of implantable subdermal contraceptive: Secondary | ICD-10-CM

## 2019-02-09 MED ORDER — ETONOGESTREL 68 MG SC IMPL
1.00 | DRUG_IMPLANT | Freq: Once | SUBCUTANEOUS | Status: AC
Start: 2019-02-09 — End: 2019-02-09
  Administered 2019-02-09: 1 via SUBCUTANEOUS

## 2019-02-09 NOTE — Progress Notes (Signed)
Family Medicine Clinic Note    Chief Complaint   Patient presents with   . Norplant     Nexplanon     SUBJECTIVE: Angelica Wade is a 33 year old female who presents to clinic for:    #Nexplanon insert:  LMP 02/04/19   Has not been taking her birth control pills, but reports no unprotected intercourse since LMP  Urine pregnancy test negative      ROS: As per HPI, otherwise denies fevers, chills, chest pain, SOB    Current Outpatient Medications   Medication Sig Dispense Refill   . fluticasone propionate (FLONASE) 50 MCG/ACT nasal spray Spray 1 spray into each nostril daily. 1 bottle 3   . gabapentin (NEURONTIN) 100 MG capsule Take 1 capsule (100 mg) by mouth 3 times daily as needed (Back pain). 90 capsule 0   . lamoTRIgine (LAMICTAL) 25 MG tablet Take 1 tablet (25 mg) by mouth daily. 30 tablet 0   . meloxicam (MOBIC) 15 MG tablet Take 1 tablet (15 mg) by mouth daily. 30 tablet 0   . traZODone (DESYREL) 50 MG tablet Trazodone 25-50 mg (half a tab or one whole tab) PO qHS PRN for help with sleep 30 tablet 1   . varenicline (CHANTIX STARTER PAK) 0.5 MG X 11 & 1 MG X 42 tablet Take one 0.5mg  tablet by mouth once daily for 3 days, then one 0.5mg  tab twice daily for 4 days, then one 1mg  tab twice daily 56 tablet 0     Current Facility-Administered Medications   Medication Dose Route Frequency Provider Last Rate Last Dose   . etonogestrel (NEXPLANON) 68 mg implant 1 Implant  1 Implant Subdermal Once Laurence ComptonGalloway, Brandalynn Ofallon J, MD         Allergies   Allergen Reactions   . Shellfish-Derived Products Swelling       Patient Active Problem List   Diagnosis   . Unspecified mood (affective) disorder (CMS-HCC)   . Chronic low back pain   . Allergic rhinitis   . Insomnia   . Anxiety     Past Medical History:   Diagnosis Date   . Allergic rhinitis    . Bipolar affective disorder (CMS-HCC)    . Chronic low back pain      Past Surgical History:   Procedure Laterality Date   . NO PAST SURGERIES       Social History     Tobacco Use   . Smoking  status: Current Some Day Smoker     Types: Cigarettes, Electronic Cigarette (Smokeless)   . Smokeless tobacco: Current User   . Tobacco comment: former cigarettes, vapes currently   Substance Use Topics   . Alcohol use: Yes     Frequency: Monthly or less   . Drug use: Never       OBJECTIVE:  BP 114/78 (BP Location: Left arm, BP Patient Position: Sitting, BP cuff size: Regular)   Pulse 83   Temp 98.6 F (37 C) (Oral)   Wt 84.8 kg (187 lb)   LMP 02/08/2019   SpO2 97%  There is no height or weight on file to calculate BMI.    GENERAL: NAD, comfortable, WDWN  RESP:  Unlabored breathing  PSYCH: normal affect  NEURO:  alert & oriented  SKIN: Intact, no rashes or erythema, no open wounds      ASSESSMENT/PLAN:  Angelica Wade was seen today for norplant.    Diagnoses and all orders for this visit:    Nexplanon insertion  Here for insertion today, no contraindications to Nexplanon. Discussed may have side effects, including headaches and irregular spotting. Risk of bruising or pain after procedure. Instructed to use backup form of birth control or abstain from sex for the next 7 days.   -     URINE PREGNANCY TEST POCT  -     etonogestrel (NEXPLANON) 68 mg implant 1 Implant    PROCEDURE : Nexplanon insertion    Consent:  Pt counseled regarding the risk/benefits/alternatives to Nexplanon. Risk discussed included: infection, bleeding, pain with placement, prolonged discomfort, damage to surrounding structures including muscles/arteries/veins/nerves/bones, risks of contraception failure, likelihood of irregular persistent spotting. Pt aknowledged these risks and still desired the Nexplanon. Pt informed of the need to use a back-up form of contraception for at least 7 days following placement.    Procedure Details:   A Time Out was conducted immediately prior to the procedure in agreement with the patient/team confirming: patient name, MRN and date of birth, consent, procedure, site, side, marking, and correct equipment/implant, if  applicable.  After consents were signed and confirmed and all pt's questions answered she was then positioned appropriately on the clinic bed.  Pt elected to have placement in her non-dominant left arm.   The medial epicondyle was identified and a spot 8cm proximal - 3 cm posterior to the bicipital groove - was identified and marked.   The area was sterilized using Chloraprep and numbed using 2cc of 1% lidocaine without epi  The nexplanon device was then removed from it's packaging in a sterile fashion, the tip of the device was placed on the skin at a 30 degree angle, and the skin was entered.   The nexplanon implant was then placed superficially with excellent tenting of the skin noted during placement.   Palpation of the device confirmed superficial placement with minimal bleeding at insertion site.   Steri-strips and 4x4 gauze pad was placed, and her arm was wrapped in Coban pressure dressing.   Pt tolerated the procedure well and was given return precautions.  She was also instructed to use a back up method of birth control for 7 days.       Follow up  Return if symptoms worsen or fail to improve.    Medication Review:  Medications reviewed with patient and medication list reconciled.  Over the counter medications, herbal therapies and supplements reviewed.  Patient's understanding and response to medications assessed.   Barriers to medications assessed and addressed.   Risks, benefits, alternatives to medications reviewed.    No barriers to learning, verbalizes understanding of teaching and instructions.    Nigel Sloop, MD  [facility]  Pager [phone]

## 2019-02-09 NOTE — Patient Instructions (Signed)
Learning About Birth Control: The Implant (Nexplanon)    What is the implant?  The implant is used to prevent pregnancy. It's a thin rod about the size of a matchstick that is inserted under the skin (subdermal) on the inside of your arm.    The implant releases the hormone progestin to prevent pregnancy. Progestin prevents pregnancy in these ways: It thickens the mucus in the cervix. This makes it hard for sperm to travel into the uterus. It also thins the lining of the uterus, which makes it harder for a fertilized egg to attach to the uterus. Progestin can sometimes stop the ovaries from releasing an egg each month (ovulation).    The implant prevents pregnancy for 3 years. Once it is put in, you don't have to do anything else to prevent pregnancy.    The implant can only be inserted and removed by your doctor or another trained health professional. These procedures can be done in your doctor's office and only take a few minutes.    Your doctor numbs the area and "injects" the implant under your skin. No cuts are made in your skin. To remove the implant, your doctor numbs the area, makes a small cut in the skin, and pulls the implant out.    How well does it work?  The implant works very well. Fewer than 1 woman out of 100 has an unplanned pregnancy.    The Nexplanon is effective within 3 days if it was put in within 5 days of the first day of your period. You should use back up contraception for 7 days after insertion.     What are the advantages of the implant?  The implant is one of the most effective methods of birth control.   It prevents pregnancy for up to 3 years. You don't have to worry about birth control for this time.   It's safe to use while breast-feeding.   The implant doesn't contain estrogen. So you can use it if you don't want to take estrogen or can't take estrogen because you have certain health problems or concerns.   It may reduce heavy bleeding and cramping.   It's convenient. It is always  providing birth control and cannot be seen. You don't need to remember to take a pill or get a shot. You don't have to interrupt sex to protect against pregnancy.    What are the disadvantages of the implant?  The implant doesn't protect against sexually transmitted infections (STIs), such as herpes or HIV/AIDS. If you aren't sure if your sex partner might have an STI, use a condom to protect against infection.   Irregular bleeding is common with Nexplanon, especially in the first 6-12 months of use. After one year, approximately 20% of women who use Implanon will stop having periods completely. Some women have longer, heavier periods. Some women will have increased spotting between periods. You may find that your periods may be hard to predict.   It may cause mood changes, less interest in sex, or weight gain.  You have to see a doctor to have an implant inserted and removed      Taking care of your implant  You can go back to normal daily activities immediately after the Nexplanon has been put in. After the numbness in your arm wears off, you may have some soreness for a day or two where the Nexplanon was inserted. You can help relieve the discomfort with Tylenol (acetaminophen), Aspirin or Advil (ibuprofen).   If your discomfort worsens or you notice redness spreading on the skin around the insertion site, please call the clinic. There also may be some swelling, bruising, or discoloration for up to two weeks. This is how to care for your arm after Nexplanon is inserted.     Please be aware of signs of infection and know how, when, and where to get medical care if needed.    1. Try not to bump the place where the Nexplanon was put in for a few days.    2. To make sure you don't get an infection where the Nexplanon was put in, keep the large gauze bandage on for 24 hours and keep it dry. Remove the large bandage after 24 hours.    3. Keep the little bandage strip on for 3-5 days, and keep it dry.    4. If you have  any redness or oozing, or anything that concerns you, call the office to have the insertion site checked.    After the incision has healed, you don't have to worry about bumping it or putting pressure on it. You can hold your child, carry books, do housework, or do whatever you usually do.      Warning Signs   Call the clinic if any of the following occurs:   Marland Kitchen You have bleeding, pus, or increasing redness, or pain at insertion site.   . You have fever or chills   . The implant comes out or you have concerns about its location.   . You have a positive pregnancy test or suspect you might be pregnant.       To speak to a nurse or physician, you may also call our clinic during normal business hours:  [phone]    For emergencies after hours or weekends, call [phone] [facility] operator) and ask to speak to the resident physician on call for family medicine.

## 2019-05-20 ENCOUNTER — Encounter (INDEPENDENT_AMBULATORY_CARE_PROVIDER_SITE_OTHER): Payer: Self-pay | Admitting: Hospital

## 2019-06-15 ENCOUNTER — Encounter (INDEPENDENT_AMBULATORY_CARE_PROVIDER_SITE_OTHER): Payer: Self-pay | Admitting: Sports Medicine

## 2019-06-15 ENCOUNTER — Ambulatory Visit (INDEPENDENT_AMBULATORY_CARE_PROVIDER_SITE_OTHER): Payer: TRICARE Prime—HMO | Admitting: Nurse Practitioner

## 2019-06-15 ENCOUNTER — Other Ambulatory Visit: Payer: Self-pay

## 2019-06-15 DIAGNOSIS — J329 Chronic sinusitis, unspecified: Secondary | ICD-10-CM

## 2019-06-15 MED ORDER — AMOXICILLIN-POT CLAVULANATE 875-125 MG OR TABS
1.00 | ORAL_TABLET | Freq: Two times a day (BID) | ORAL | 0 refills | Status: AC
Start: 2019-06-15 — End: 2019-06-25

## 2019-06-15 MED ORDER — OXYMETAZOLINE HCL 0.05 % NA SOLN
2.00 | Freq: Two times a day (BID) | NASAL | 0 refills | Status: DC
Start: 2019-06-15 — End: 2020-10-27

## 2019-06-15 NOTE — Interdisciplinary (Signed)
See progress note.

## 2019-06-15 NOTE — Patient Instructions (Signed)

## 2019-06-15 NOTE — Progress Notes (Signed)
Express [facility] Video Visit:     Demographics:   Medical Record #: [id number]   Date: June 15, 2019   Patient Name: Angelica Wade   DOB: September 05, 1985  Age: 33 year old  Sex: female      Chief Complaint:   Chief Complaint   Patient presents with   . Sinus Problem       Case summary is as follows: The Angelica Wade is a 33 year old female  who presents with sinus congestion and pressure for 10 days.    she has tried claritin, flonase and sidafed OTC at home for last week without relief.     No headache, neck pain or vision changes, no fever or loss of sense of taste or smell    Last LMP:  Primary Care Physician:     Past Medical History:  Past Medical History:   Diagnosis Date   . Allergic rhinitis    . Bipolar affective disorder (CMS-HCC)    . Chronic low back pain        Past Surgical History:  Past Surgical History:   Procedure Laterality Date   . NO PAST SURGERIES         Past Social History:   Social History     Socioeconomic History   . Marital status: Married     Spouse name: Not on file   . Number of children: 2   . Years of education: Not on file   . Highest education level: Not on file   Occupational History   . Not on file   Social Needs   . Financial resource strain: Not on file   . Food insecurity     Worry: Not on file     Inability: Not on file   . Transportation needs     Medical: Not on file     Non-medical: Not on file   Tobacco Use   . Smoking status: Current Some Day Smoker     Types: Cigarettes, Electronic Cigarette (Smokeless)   . Smokeless tobacco: Current User   . Tobacco comment: former cigarettes, vapes currently   Substance and Sexual Activity   . Alcohol use: Yes     Frequency: Monthly or less   . Drug use: Never   . Sexual activity: Yes     Partners: Male     Comment: married   Lifestyle   . Physical activity     Days per week: Not on file     Minutes per session: Not on file   . Stress: Not on file   Relationships   . Social Product manager on phone: Not on file     Gets together: Not  on file     Attends religious service: Not on file     Active member of club or organization: Not on file     Attends meetings of clubs or organizations: Not on file     Relationship status: Not on file   . Intimate partner violence     Fear of current or ex partner: Not on file     Emotionally abused: Not on file     Physically abused: Not on file     Forced sexual activity: Not on file   Other Topics Concern   . Not on file   Social History Narrative   . Not on file       Family History   Problem Relation Name Age of  Onset   . Bipolar Disorder Mother     . Diabetes Mother     . Heart Disease Mother     . Alcohol/Drug Mother     . Breast Cancer Mother       Medication Review Moment  Cannot display prior to admission medications because the patient has not been admitted in this contact.       Review of Systems   Review of Systems   Constitutional: Negative for chills and fever.   HENT: Positive for congestion, postnasal drip and sinus pain.    Eyes: Negative.    Respiratory: Negative.    Cardiovascular: Negative.    Genitourinary: Negative.    Neurological: Negative.    All other systems reviewed and are negative.       Physical Examination:   Angelica Wade is a well developed well nourished female in no apparent distress. she is alert and oriented *3.   Physical Exam  Constitutional:       Appearance: Normal appearance.   HENT:      Nose: Nose normal.   Eyes:      Conjunctiva/sclera: Conjunctivae normal.   Neurological:      Mental Status: She is alert and oriented to person, place, and time.   Psychiatric:         Mood and Affect: Mood normal.         Behavior: Behavior normal.         Impression:     ICD-10-CM ICD-9-CM    1. Sinusitis, unspecified chronicity, unspecified location  J32.9 473.9 amoxicillin-clavulanate (AUGMENTIN) 875-125 MG tablet     Sinus pressure, PND and facial pain for 10 days despite multiple otc meds   augmentin / afrin and fu with pcp    Follow up precautions including ER and Urgent care  precautions given. Patient to arrange follow up. Patient comfortable with plan.     Angelica Maser, NP   Supervising Physician: Alita Chyle, MD      ---------------------(data below generated by Berkley Harvey, NP)--------------------    Patient Verification & Telemedicine Consent:    I have verified this is the correct patient and have obtained verbal consent from the patient/ surrogate to perform this voluntary telemedicine evaluation (including obtaining history, performing visual examination and reviewing data provided by the patient).   The patient/ surrogate has the right to refuse this evaluation.  I have explained risks (including potential loss of confidentiality), benefits, alternatives, and the potential need for subsequent face to face care. Patient/ surrogate understands that there is a risk of medical inaccuracies given that our recommendations will be made based on reported data (and we must therefore assume this information is accurate).  Knowing that there is a risk that this information is not reported accurately, and that the telemedicine video, audio, or data feed may be incomplete, the patient agrees to proceed with evaluation and holds [place] harmless knowing these risks.  The patient/ surrogate has been notified that other healthcare professionals (including students, residents and Engineer, maintenance) may be involved in this audio-video evaluation.   All laws concerning confidentiality and patient access to medical records and copies of medical records apply to telemedicine.  I have reviewed this above verification and consent paragraph with the patient/ surrogate.  If the patient is not capacitated to understand the above, and no surrogate is available, since this is not an emergency evaluation, the visit will be rescheduled until such time that the patient can consent, or the  surrogate is available to consent.

## 2019-06-15 NOTE — Telephone Encounter (Signed)
From: Cheron Schaumann  To: Edythe Clarity, MD  Sent: 06/15/2019 9:21 AM PDT  Subject: 1-Non Urgent Medical Advice    Hi good morning. So a week ago i was getting a sinus infection instead of going in I just did Flonase and a allergy pill Claritin . But it seems that all the mucus is going to my chest causing a infection. So my question is do I have to come in for that or is there something you can prescribe me without me coming in I know for sure I have a sinus infection that's just going to my chest . No other symptoms but the mucus and the face pressure. Thank you and have a blessed day .

## 2019-06-23 ENCOUNTER — Other Ambulatory Visit: Payer: Self-pay

## 2019-06-23 ENCOUNTER — Encounter (INDEPENDENT_AMBULATORY_CARE_PROVIDER_SITE_OTHER): Payer: Self-pay | Admitting: Family Practice

## 2019-06-23 ENCOUNTER — Telehealth (INDEPENDENT_AMBULATORY_CARE_PROVIDER_SITE_OTHER): Payer: TRICARE Prime—HMO | Admitting: Family Practice

## 2019-06-23 ENCOUNTER — Ambulatory Visit (INDEPENDENT_AMBULATORY_CARE_PROVIDER_SITE_OTHER): Payer: Self-pay | Admitting: Sports Medicine

## 2019-06-23 DIAGNOSIS — B379 Candidiasis, unspecified: Secondary | ICD-10-CM

## 2019-06-23 MED ORDER — FLUCONAZOLE 150 MG OR TABS
ORAL_TABLET | ORAL | 1 refills | Status: DC
Start: 2019-06-23 — End: 2019-10-08

## 2019-06-23 NOTE — Telephone Encounter (Signed)
From: Cheron Schaumann  To: Edythe Clarity, MD  Sent: 06/23/2019 2:25 AM PDT  Subject: 1-Non Urgent Medical Advice    Hi so I'm on antibiotics for my sinus infection amoxicillin and I believe I got a yeast infection with it it's very itchy and uncomfortable at first I thought it was maybe from me changing the scent of my laundry detergent which can be part of it too but I read online amoxicillin can give you a yeast infection so I think the both are irritating me . Thank you .

## 2019-06-23 NOTE — Interdisciplinary (Signed)
Pre-visit chart review and huddle completed with staff and physician.    Outstanding labs, imaging and consults reviewed and identified.    Health maintanence issues identified and addressed:    Health Maintenance   Topic Date Due   . Pneumococcal 0-64 yrs (1 of 1 - PPSV23) 05/05/1993   . PHQ9 Depression Monitoring doc flowsheet  04/17/2019   . Cervical Cancer Screening  09/03/2020   . Tetanus (3 - Td) 03/03/2028   . Influenza  Completed   . Polio Vaccine  Aged Out   . HPV Vaccine <= 26 Yrs  Aged Out   . Meningococcal MCV4 Vaccine  Aged Out

## 2019-06-23 NOTE — Telephone Encounter (Signed)
Symptom Call          Next office visit:  Visit date not found  List the date of PCP first available: (only fill in if Same Day/3 Day appt not scheduled):  Acute:*                   Return:*  Did you offer Express Care/Urgent Care: (if met criteria for Same Day/3 Day scheduling but declines to schedule) No    What symptom is the patient experiencing? My Chart Message: Yeast infection       Name of PCP Provider: Edythe Clarity PCP  Insurance Coverage Verified: Active- in network  Last office visit: 02/09/2019    Who is reporting the symptoms? Incoming call from patient    Is this a new or ongoing symptom? new  Estimated time since experiencing symptom(s)? NA    Best way to contact patient: [phone] Home   Alternative communication method: NA Home   @HOMEPHONE @  @WORKPHONE @      [place] [ [phone] ]      *Select a method of escalation for this call:     1. *Warm transfer to [place] RN at extension #[phone]  2. *Cold transfer to triage nurse to extension #[phone]  3. *After hours 5PM   a. For sites: [facility], [facility], [facility], [facility], [facility], [facility] request On-Call Provider page via operator at [phone]  b. For [facility]: contact On-Call Provider directly at [phone]  c.

## 2019-06-23 NOTE — Progress Notes (Signed)
FAMILY MEDICINE TELEMEDICINE PROGRESS NOTE    CC:  No chief complaint on file.      SUBJECTIVE:    Angelica Wade is a 33 year old female who is being seen for the following issues:    ---------------------(data below generated by Franz Dell, MD)--------------------    Patient Verification & Telemedicine Consent:    I am proceeding with this evaluation at the direct request of the patient.  I have verified this is the correct patient and have obtained verbal consent and written consent from the patient/ surrogate to perform this voluntary telemedicine evaluation (including obtaining history, performing examination and reviewing data provided by the patient).   The patient/ surrogate has the right to refuse this evaluation.  I have explained risks (including potential loss of confidentiality), benefits, alternatives, and the potential need for subsequent face to face care. Patient/ surrogate understands that there is a risk of medical inaccuracies given that our recommendations will be made based on reported data (and we must therefore assume this information is accurate).  Knowing that there is a risk that this information is not reported accurately, and that the telemedicine video, audio, or data feed may be incomplete, the patient agrees to proceed with evaluation and holds [place] harmless knowing these risks. In this evaluation, we will be providing recommendations only.  The ultimate decision to follow, or not follow, these recommendations will be left to the bedside treating/ requesting practitioner.  The patient/ surrogate has been notified that other healthcare professionals (including students, residents and Engineer, maintenance) may be involved in this audio-video evaluation.   All laws concerning confidentiality and patient access to medical records and copies of medical records apply to telemedicine.  The patient/ surrogate has received the [place] Notice of Privacy Practices.  I have reviewed this above  verification and consent paragraph with the patient/ surrogate.  If the patient is not capacitated to understand the above, and no surrogate is available, since this is not an emergency evaluation, the visit will be rescheduled until such time that the patient can consent, or the surrogate is available to consent.    Demographics:   Medical Record #: [id number]   Date: June 23, 2019   Patient Name: Angelica Wade   DOB: 10-Dec-1985  Age: 33 year old  Sex: female  Location: Home address on file    Evaluator(s):   Angelica Wade was evaluated by me today.    Clinic Location:  [facility]   [facility]  [address]  [place] [address]      HPI by Problem:     SUBJECTIVE:   33 year old female complains of white vaginal discharge, itching and burning in the private area for 2-3 days.  Denies abnormal vaginal bleeding or significant pelvic pain or  fever. No UTI symptoms. Denies history of known exposure to STD.  Patient states she was recently started on antibiotic for sinus infection, she is still taking it, 6 more days left to complete the course.  No changes in urinary frequency, denies hematuria.    Patient's last menstrual period was 05/20/2019.    Review of Systems:   She denies constitutional symptoms of fatigue, weakness, weight loss or gain, fevers, night sweats.  The patient denies abdominal or flank pain, anorexia, nausea or vomiting, dysphagia, change in bowel habits or black or bloody stools.          Patient Active Problem List   Diagnosis   .  Unspecified mood (affective) disorder (CMS-HCC)   . Chronic low back pain   . Allergic rhinitis   . Insomnia   . Anxiety   . Nexplanon in place       Outpatient Medications Prior to Visit   Medication Sig Dispense Refill   . amoxicillin-clavulanate (AUGMENTIN) 875-125 MG tablet Take 1 tablet by mouth 2 times daily for 10 days. 20 tablet 0   . fluticasone propionate (FLONASE) 50 MCG/ACT nasal spray Spray 1 spray  into each nostril daily. 1 bottle 3   . gabapentin (NEURONTIN) 100 MG capsule Take 1 capsule (100 mg) by mouth 3 times daily as needed (Back pain). 90 capsule 0   . lamoTRIgine (LAMICTAL) 25 MG tablet Take 1 tablet (25 mg) by mouth daily. 30 tablet 0   . meloxicam (MOBIC) 15 MG tablet Take 1 tablet (15 mg) by mouth daily. 30 tablet 0   . oxymetazoline (AFRIN) 0.05 % nasal spray Spray 2 sprays into each nostril 2 times daily. Recommend limiting use to three days only 1 bottle 0   . traZODone (DESYREL) 50 MG tablet Trazodone 25-50 mg (half a tab or one whole tab) PO qHS PRN for help with sleep 30 tablet 1   . varenicline (CHANTIX STARTER PAK) 0.5 MG X 11 & 1 MG X 42 tablet Take one 0.5mg  tablet by mouth once daily for 3 days, then one 0.5mg  tab twice daily for 4 days, then one 1mg  tab twice daily 56 tablet 0     No facility-administered medications prior to visit.            OBJECTIVE:  Physical Exam: General Appearance: healthy, alert, no distress, pleasant affect, cooperative.  Heart/CV:  No cyanosis,   Lungs:No resp distress, or accessory muscles use, able to complete his sentences.         LABS:  No results found for this or any previous visit.        ASSESSMENT & PLAN:  Angelica Wade is a 33 year old female was seen today for:  Diagnoses and all orders for this visit:    Yeast infection  -     fluCONazole (DIFLUCAN) 150 MG tablet; One dose today, repeat after the antibiotic treatment is completed (6 days later)  -history suggestive of yeast infection, will try the above, advised patient to use 1 today and 1 tablet at the end of treatment.  -may use over-the-counter lidocaine for symptomatic relief, on in the outside.    Health Maintenance   Topic Date Due   . Pneumococcal 0-64 yrs (1 of 1 - PPSV23) 05/05/1993   . PHQ9 Depression Monitoring doc flowsheet  04/17/2019   . Cervical Cancer Screening  09/03/2020   . Tetanus (3 - Td) 03/03/2028   . Influenza  Completed   . Polio Vaccine  Aged Out   . HPV Vaccine <= 26  Yrs  Aged Out   . Meningococcal MCV4 Vaccine  Aged Out       No follow-ups on file.    There are no Patient Instructions on file for this visit.    Lurlean Horns, MD      Plan discussed with pt including risks/benefits/alternatives including watchful waiting.  Informed pt of 24/7 on call MD.  ED if acutely worsening after hours.  Pt verbalized understanding.    Medications reviewed with patient and medication list reconciled.  Over the counter medications, herbal therapies and supplements reviewed.  Patient's understanding and response to medications assessed.  Barriers to medications assessed and addressed.  Risks, benefits, alternatives to medications reviewed.

## 2019-06-23 NOTE — Telephone Encounter (Signed)
PROVIDER ACTION REQUESTED: NO, FYI Only  Action item needed:  None.  Appt scheduled:   Yes  Future Appointments   Date Time Provider [place]   06/23/2019  2:00 PM Lurlean Horns, MD [facility]       Chief Complaint  Yeast infection symptoms x 2 days.  Assessment details:  Severe pain not relieved by Motrin, and severe itching and redness. White cottage cheese discharge, stinging sensation with urination. Currently taking Amoxicillin for sinus infection.    CLINIC/RN/LVN ACTION REQUEST: NO, FYI Only  Action item needed: None.    Scheduled MCVV appt per pt request. Pt aware of the process for MCVV.    2020 FLU VACCINE: YES  COVID TRIAGE PROTOCOL: N/A      Reason for Call: Vaginal Problem    Disposition: See Provider within 4 hours       Reason for Disposition  . [1] SEVERE pain AND [2] not improved 2 hours after pain medicine    Additional Information  . Negative: Sounds like a life-threatening emergency to the triager  . Negative: Followed a genital area injury  . Negative: Foreign body in vagina (e.g., tampon)  . Negative: Vaginal bleeding is main symptom  . Negative: Vaginal discharge is main symptom  . Negative: Pain or burning with passing urine (urination) is main symptom  . Negative: Menstrual cramps is main symptom  . Negative: Abdomen pain is main symptom  . Negative: Pubic lice suspected  . Negative: Itching or rash of external female genital area (vulva)  . Negative: Labor suspected  . Negative: Patient sounds very sick or weak to the triager    Answer Assessment - Initial Assessment Questions  1. SYMPTOM: "What's the main symptom you're concerned about?" (e.g., pain, itching, dryness)      Itching, redness, puffiness. White cottage cheese like discharge.   2. LOCATION: "Where is the itching located?" (e.g., inside/outside, left/right)      Outside.  3. ONSET: "When did the itching start?"      2 days ago.  4. PAIN: "Is there any pain?" If so, ask: "How bad is it?" (Scale: 1-10; mild,  moderate, severe)      Yes, severe external pain.   5. ITCHING: "Is there any itching?" If so, ask: "How bad is it?" (Scale: 1-10; mild, moderate, severe)      Yes, severe.  6. CAUSE: "What do you think is causing the discharge?" "Have you had the same problem before? What happened then?"      Yeast infection.  7. OTHER SYMPTOMS: "Do you have any other symptoms?" (e.g., fever, itching, vaginal bleeding, pain with urination, injury to genital area, vaginal foreign body)      Stinging sensation with urination.   8. PREGNANCY: "Is there any chance you are pregnant?" "When was your last menstrual period?"      Denies pregnancy.    Protocols used: VAGINAL [facility]

## 2019-09-10 ENCOUNTER — Ambulatory Visit (INDEPENDENT_AMBULATORY_CARE_PROVIDER_SITE_OTHER): Payer: Self-pay | Admitting: Sports Medicine

## 2019-09-11 ENCOUNTER — Encounter (INDEPENDENT_AMBULATORY_CARE_PROVIDER_SITE_OTHER): Payer: Self-pay

## 2019-09-11 ENCOUNTER — Encounter (INDEPENDENT_AMBULATORY_CARE_PROVIDER_SITE_OTHER): Payer: Self-pay | Admitting: Sports Medicine

## 2019-09-11 ENCOUNTER — Emergency Department (INDEPENDENT_AMBULATORY_CARE_PROVIDER_SITE_OTHER): Payer: TRICARE Prime—HMO | Admitting: Emergency Medicine

## 2019-09-11 VITALS — BP 130/85 | HR 85 | Temp 97.1°F | Resp 16

## 2019-09-11 DIAGNOSIS — T148XXA Other injury of unspecified body region, initial encounter: Secondary | ICD-10-CM

## 2019-09-11 MED ORDER — CYCLOBENZAPRINE HCL 10 MG OR TABS
10.0000 mg | ORAL_TABLET | Freq: Three times a day (TID) | ORAL | 0 refills | Status: DC | PRN
Start: 2019-09-11 — End: 2020-01-26

## 2019-09-11 MED ORDER — HYDROCODONE-ACETAMINOPHEN 5-325 MG OR TABS
1.0000 | ORAL_TABLET | Freq: Four times a day (QID) | ORAL | 0 refills | Status: DC | PRN
Start: 2019-09-11 — End: 2020-02-18

## 2019-09-11 NOTE — Telephone Encounter (Signed)
PROVIDER ACTION REQUESTED: NO, FYI Only  Action item needed:  none  Appt scheduled: Yes, [facility] at 1845, 09/11/19 -patient does not have a babysitter, can only come after husband arrives after 1800 from work    Stage manager Complaint/Assessment details:    CC: mid to low one sided back pain, non-radiating, intermittent x 1.5 months, but worsening x this week causing h/a and nausea when pain is present, constipated, gassy x 3-4 days, usually has bm daily  -Pain worse with bending, sleeping or with certain movements, has been taking tylenol but currently no longer helping   -Pt states she drinks a lot of water but admits to not really including a lot of fiber, eat a lot of protein than fiber  -Denies: Fever or chills, cough, sob/diff breathing, fatigue, muscle or body aches that is flu like, headache unless having the back pain, loss of taste or smell, sore throat, congestion or runny nose, nausea unless having back pain or vomiting, diarrhea, covid r/t exposure, pregnancy, black/tarry looking stool, blood in stool, jaundice sxs, diet changes, back trauma, Dysuria, oliguria, polyuria, hematuria, burning sensation when urinating, dizziness, foul odor to urine, cloudy urine, b/b incontinence, weakness/numbness/tingling sensation    CLINIC/RN/LVN ACTION REQUEST: NO, FYI Only  Action item needed: NO, FYI Only    Time secured at Urgent Care and pt given strict ED precautions and reviewed all applicable Care Advice per protocol.     2020 FLU VACCINE: NO   COVID TRIAGE PROTOCOL: NEGATIVE       Reason for Call: Back Pain    Disposition: See Provider within 4 hours       Reason for Disposition  . [1] SEVERE back pain (e.g., excruciating, unable to do any normal activities) AND [2] not improved 2 hours after pain medicine    Additional Information  . Negative: Passed out (i.e., lost consciousness, collapsed and was not responding)  . Negative: Shock suspected (e.g., cold/pale/clammy skin, too weak to stand, low BP, rapid pulse)  .  Negative: Sounds like a life-threatening emergency to the triager  . Negative: Major injury to the back (e.g., MVA, fall > 10 feet or 3 meters, penetrating injury, etc.)  . Negative: Followed a tailbone injury  . Negative: [1] Pain in the upper back over the ribs (rib cage) AND [2] radiates (travels, goes) into chest  . Negative: [1] Pain in the upper back over the ribs (rib cage) AND [2] worsened by coughing (or clearly increases with breathing)  . Negative: Back pain during pregnancy  . Negative: Pain mainly in flank (i.e., in the side, over the lower ribs or just below the ribs)  . Negative: [1] SEVERE back pain (e.g., excruciating) AND [2] sudden onset AND [3] age > 10  . Negative: [1] Unable to urinate (or only a few drops) > 4 hours AND [2] bladder feels very full (e.g., palpable bladder or strong urge to urinate)  . Negative: [1] Loss of bladder or bowel control (urine or bowel incontinence; wetting self, leaking stool) AND [2] new onset  . Negative: Numbness in groin or rectal area (i.e., loss of sensation)  . Negative: [1] SEVERE abdominal pain AND [2] present > 1 hour  . Negative: [1] Abdominal pain AND [2] age > 67  . Negative: Weakness of a leg or foot (e.g., unable to bear weight, dragging foot)  . Negative: Unable to walk  . Negative: Patient sounds very sick or weak to the triager    Answer Assessment - Initial  Assessment Questions  1. ONSET: "When did the pain begin?"       CC: mid to low one sided back pain, non-radiating, intermittent x 1.5 months, but worsening x this week causing h/a and nausea when pain is present, constipated, gassy x 3-4 days, usually has bm daily  -Pain worse with bending, sleeping or with certain movements, has been taking tylenol but currently no longer helping   -Pt states she drinks a lot of water but admits to not really including a lot of fiber, eat a lot of protein than fiber  -Denies: Fever or chills, cough, sob/diff breathing, fatigue, muscle or body aches that is  flu like, headache unless having the back pain, loss of taste or smell, sore throat, congestion or runny nose, nausea unless having back pain or vomiting, diarrhea, covid r/t exposure, pregnancy, black/tarry looking stool, blood in stool, jaundice sxs, diet changes, back trauma, Dysuria, oliguria, polyuria, hematuria, burning sensation when urinating, dizziness, foul odor to urine, cloudy urine, b/b incontinence, weakness/numbness/tingling sensation    2. LOCATION: "Where does it hurt?" (upper, mid or lower back)      Mid to low back/more side of back than middle  3. SEVERITY: "How bad is the pain?"  (e.g., Scale 1-10; mild, moderate, or severe)    - MILD (1-3): doesn't interfere with normal activities     - MODERATE (4-7): interferes with normal activities or awakens from sleep     - SEVERE (8-10): excruciating pain, unable to do any normal activities       Moderate to severe when present  4. PATTERN: "Is the pain constant?" (e.g., yes, no; constant, intermittent)       Comes and goes  5. RADIATION: "Does the pain shoot into your legs or elsewhere?"      denied  6. CAUSE:  "What do you think is causing the back pain?"       Not sure  7. BACK OVERUSE:  "Any recent lifting of heavy objects, strenuous work or exercise?"      denied  8. MEDICATIONS: "What have you taken so far for the pain?" (e.g., nothing, acetaminophen, NSAIDS)      tylenol  9. NEUROLOGIC SYMPTOMS: "Do you have any weakness, numbness, or problems with bowel/bladder control?"      denied  10. OTHER SYMPTOMS: "Do you have any other symptoms?" (e.g., fever, abdominal pain, burning with urination, blood in urine)        denied  11. PREGNANCY: "Is there any chance you are pregnant?" (e.g., yes, no; LMP)        denied    Protocols used: BACK PAIN-A-AH

## 2019-09-11 NOTE — Progress Notes (Signed)
Angelica Wade  MRN: [id number]  DOB: 11-08-1985  PMD: Laurence Compton        Chief Complaint:   Chief Complaint   Patient presents with   . Back Pain     Pt c/o L side and L back pain x > 1 month       HPI:  Angelica Wade is a 34 year old female c/o as above; pain is positional; no uti sx        Past Medical History:   Diagnosis Date   . Allergic rhinitis    . Bipolar affective disorder (CMS-HCC)    . Chronic low back pain      Past Surgical History:   Procedure Laterality Date   . NO PAST SURGERIES       Social History     Socioeconomic History   . Marital status: Married     Spouse name: Not on file   . Number of children: 2   . Years of education: Not on file   . Highest education level: Not on file   Occupational History   . Not on file   Social Needs   . Financial resource strain: Not on file   . Food insecurity     Worry: Not on file     Inability: Not on file   . Transportation needs     Medical: Not on file     Non-medical: Not on file   Tobacco Use   . Smoking status: Current Some Day Smoker     Types: Cigarettes, Electronic Cigarette (Smokeless)   . Smokeless tobacco: Current User   . Tobacco comment: former cigarettes, vapes currently   Substance and Sexual Activity   . Alcohol use: Yes     Frequency: Monthly or less   . Drug use: Never   . Sexual activity: Yes     Partners: Male     Comment: married   Lifestyle   . Physical activity     Days per week: Not on file     Minutes per session: Not on file   . Stress: Not on file   Relationships   . Social Wellsite geologist on phone: Not on file     Gets together: Not on file     Attends religious service: Not on file     Active member of club or organization: Not on file     Attends meetings of clubs or organizations: Not on file     Relationship status: Not on file   . Intimate partner violence     Fear of current or ex partner: Not on file     Emotionally abused: Not on file     Physically abused: Not on file     Forced sexual activity: Not on file    Other Topics Concern   . Not on file   Social History Narrative   . Not on file     Family History   Problem Relation Name Age of Onset   . Bipolar Disorder Mother     . Diabetes Mother     . Heart Disease Mother     . Alcohol/Drug Mother     . Breast Cancer Mother       Current medications:  Current Outpatient Medications   Medication Sig   . fluCONazole (DIFLUCAN) 150 MG tablet One dose today, repeat after the antibiotic treatment is completed (6 days later)   . fluticasone  propionate (FLONASE) 50 MCG/ACT nasal spray Spray 1 spray into each nostril daily.   Marland Kitchen gabapentin (NEURONTIN) 100 MG capsule Take 1 capsule (100 mg) by mouth 3 times daily as needed (Back pain).   Marland Kitchen lamoTRIgine (LAMICTAL) 25 MG tablet Take 1 tablet (25 mg) by mouth daily.   . meloxicam (MOBIC) 15 MG tablet Take 1 tablet (15 mg) by mouth daily.   Marland Kitchen oxymetazoline (AFRIN) 0.05 % nasal spray Spray 2 sprays into each nostril 2 times daily. Recommend limiting use to three days only   . traZODone (DESYREL) 50 MG tablet Trazodone 25-50 mg (half a tab or one whole tab) PO qHS PRN for help with sleep   . varenicline (CHANTIX STARTER PAK) 0.5 MG X 11 & 1 MG X 42 tablet Take one 0.5mg  tablet by mouth once daily for 3 days, then one 0.5mg  tab twice daily for 4 days, then one 1mg  tab twice daily     No current facility-administered medications for this visit.        I have reviewed the patient's medical history as available in EPIC.    Review of Systems   Constitutional: No fever, chills, malaise, or fatigue  Eyes:  No eye pain or drainage  ENT:  No ear pain, sore throat, or oral lesions  Cardiovascular: No chest pain, palpitations, or syncope.  Respiratory: No cough, shortness of breath, or wheezing.  Gastrointestinal: No abdominal pain, n/v/d/c  Genitourinary: No dysuria, urgency, frequency, or flank pain.  Musculoskeletal: No acute muscle or joint pains; + back pain  Skin:  No rash or new lesions.  Neurological: No h/a, dizziness, acute numbness or  weakness.  Psychiatric/Behavioral: No acute anxiety, depression, or SI.  Endocrine:  No polydipsia, polyuria, heat/cold intolerance, skin or hair changes  Hematologic/lymphatic:  No swollen or painful LN; No unexplained bruising or bleeding.  Allergic/Immunologic:  Not immunocompromised            Physical Examination:    09/11/19  1901   BP: 130/85   Pulse: 85   Resp: 16   Temp: 97.1 F (36.2 C)   SpO2: 98%     Constitutional:  Well appearing, no distress  Eyes:  Anicteric. No conjunctival injection.  Ears:  EACs without swelling, drainage, or tend.  TMs intact without erythema or bulging.   Mouth:  MMM. No OP erythema or mucosal lesions.  Neck:  Supple.  Lungs:  clear.  No rales, rhonchi, or wheezing.   Cardiovascular:  RRR without murmur.  No peripheral edema, no cyanosis. Cap refill <2 sec  Abdominal:  ND, soft and non-tender.   GU:  No CVAT.    Musculoskeletal:  MAE without pain; left flank pain on ROM  Neuro:  Normal mentation.   Skin: Warm, dry, intact.     Psych:  Calm, cooperative, appropriate behavior  Hematologic/lymphatic:  No AC LN.  No petechiae or purpura.         Diagnostics (if performed):  No results found for this or any previous visit.      Assessment/Plan:   Muscle strain/ ibuprofen, flexeril, norco, rest, f/u pcp rtc prn    -Primary care f/u  -Return precautions    No diagnosis found.

## 2019-09-11 NOTE — Telephone Encounter (Signed)
From: [name]  To: Laurence Compton, MD  Sent: 09/11/2019 10:58 AM PST  Subject: 1-Non Urgent Medical Advice    The number given to me is not working so I can't call . Please call me .

## 2019-09-11 NOTE — Telephone Encounter (Signed)
Who is calling: Incoming call from patient  Insurance Coverage Verified: Active- in network  Reason for this call: returning call for office to speak to triage nurse.    Action required by office: transferd her to triage line    Duplicate encounter? No previous documentation found on this issue.     Best way to contact: [phone]  Alternative:     Inquiry has been read verbatim to this caller. Verbalizes satisfaction and confirms the above is accurate: yes      Has been advised this message will be transmitted to office and can expect a response within the next 24-72 hours.

## 2019-09-11 NOTE — Interdisciplinary (Signed)
Two pt IDs verified.  Escorted pt into clinic and into exam room #9.  Updated on plan of care.  VS taken.  Comfort measures: pt declined.

## 2019-09-11 NOTE — Telephone Encounter (Signed)
From: [name]  To: Laurence Compton, MD  Sent: 09/10/2019 11:41 PM PST  Subject: 1-Non Urgent Medical Advice    Hi how are you doing .    I have been have troubles with my lower left side I have shape pain and it hurts to sleep bend over or even move sometimes . It goes off and on so didn't think much of it but When it comes back it comes back more painful . I have two kids so being in pain all day does not work for me . Please get back to me ASAP . I don't have a babysitter or any family here or nothing so I would have to take my kids with me .

## 2019-09-11 NOTE — Telephone Encounter (Signed)
I have attempted to contact this patient by phone with the following results: RN called patient in regard to my chart message, no answer, left voicemail to return call to clinic, phone number included..  Mychart message also sent to pt.      CALL CENTER:  When patient calls back:  Please ask when is the best time to reach them  Confirm telephone number  Any RN can take the call      Triage RN had attempted to contact this patient by phone with the following results: answering machine.   Left the following message:  --call back to discuss symptoms and concerns with a triage RN between 0800 and 1700.   --If calling after 1700, pt will have an opportunity to speak to the on-call provider  --For any worsening symptoms and needs to be seen today, pt to go the nearest Urgent Care  --For any severe symptoms, proceed to the nearest emergency room or call 911  --Triage phone number included.      Mychart sent   Triage RNs to continue to follow

## 2019-09-11 NOTE — Interdisciplinary (Signed)
Two pt identifiers and allergies verified.    Intake done via telephone.  Pt informed on plan of care.

## 2019-09-11 NOTE — Patient Instructions (Signed)
Muscle Strain, General    You have been diagnosed with a muscle strain.    Any muscle in the body can be strained. A strain is an injury to muscles where some muscle fibers are injured by being stretched or partly torn. This usually happens from using the muscle too much or from doing an activity the muscle is not used to.    Some strain symptoms are pain, muscle cramping and soreness to the touch.    Often, muscle pain and stiffness are worse the next day. This is much like what happens when someone starts exercising for the first time. After exercising, the person may feel pretty good. However, the next day all the exercised muscles are stiff and sore.    General strain treatment includes:   Resting the affected part.   Pain medicine.   Muscle relaxant medicines.   Warm compresses (such as a warm, moist towel).   Gently stretching the injured muscle.   When tolerated, gently massaging the injured area.    This injury is self-limited (it gets better on its own). It rarely needs specific treatment.    YOU SHOULD SEEK MEDICAL ATTENTION IMMEDIATELY, EITHER HERE OR AT THE NEAREST EMERGENCY DEPARTMENT, IF ANY OF THE FOLLOWING OCCURS:   Major increase in swelling of the affected area.   Pain gets worse instead of gradually improving.   Skin gets red over the affected area.   Unable to use the affected limb. Limb weakness or numbness.

## 2019-09-12 ENCOUNTER — Encounter: Payer: Self-pay | Admitting: Hospital

## 2019-09-15 NOTE — Telephone Encounter (Signed)
Pt seen in [place] 1/8 for muscle strain.

## 2019-09-18 ENCOUNTER — Encounter: Payer: Self-pay | Admitting: Hospital

## 2019-10-07 ENCOUNTER — Encounter (INDEPENDENT_AMBULATORY_CARE_PROVIDER_SITE_OTHER): Payer: Self-pay | Admitting: Sports Medicine

## 2019-10-07 NOTE — Telephone Encounter (Signed)
Scheduled

## 2019-10-07 NOTE — Telephone Encounter (Signed)
From: [name]  To: Laurence Compton, MD  Sent: 10/07/2019 11:22 AM PST  Subject: 1-Non Urgent Medical Advice    Hi so for the past few months actually over a year now I've been dealing with headaches over the past 6 months they have been getting worse and more often they are like migraines and they hurt to the point that I have to lay down and sleep . What can I do for it all the over the counters don't work .

## 2019-10-07 NOTE — Telephone Encounter (Signed)
Please schedule for appointment to discuss further. Thanks

## 2019-10-08 ENCOUNTER — Other Ambulatory Visit: Payer: Self-pay

## 2019-10-08 ENCOUNTER — Telehealth (INDEPENDENT_AMBULATORY_CARE_PROVIDER_SITE_OTHER): Payer: TRICARE Prime—HMO | Admitting: Sports Medicine

## 2019-10-08 ENCOUNTER — Encounter (INDEPENDENT_AMBULATORY_CARE_PROVIDER_SITE_OTHER): Payer: Self-pay | Admitting: Sports Medicine

## 2019-10-08 DIAGNOSIS — G43109 Migraine with aura, not intractable, without status migrainosus: Secondary | ICD-10-CM

## 2019-10-08 MED ORDER — RIZATRIPTAN BENZOATE 10 MG OR TABS
10.0000 mg | ORAL_TABLET | Freq: Once | ORAL | 1 refills | Status: DC | PRN
Start: 2019-10-08 — End: 2020-06-17

## 2019-10-08 NOTE — Progress Notes (Signed)
FAMILY MEDICINE TELEMEDICINE PROGRESS NOTE    CC:    Chief Complaint   Patient presents with   . Migraine     SUBJECTIVE:    Angelica Wade is a 34 year old female who is being seen for the following issues:    ---------------------(data below generated by Laurence Compton, MD)--------------------    Patient Verification & Telemedicine Consent:    I am proceeding with this evaluation at the direct request of the patient.  I have verified this is the correct patient and have obtained verbal consent and written consent from the patient/ surrogate to perform this voluntary telemedicine evaluation (including obtaining history, performing examination and reviewing data provided by the patient).   The patient/ surrogate has the right to refuse this evaluation.  I have explained risks (including potential loss of confidentiality), benefits, alternatives, and the potential need for subsequent face to face care. Patient/ surrogate understands that there is a risk of medical inaccuracies given that our recommendations will be made based on reported data (and we must therefore assume this information is accurate).  Knowing that there is a risk that this information is not reported accurately, and that the telemedicine video, audio, or data feed may be incomplete, the patient agrees to proceed with evaluation and holds [place] harmless knowing these risks. In this evaluation, we will be providing recommendations only.  The ultimate decision to follow, or not follow, these recommendations will be left to the bedside treating/ requesting practitioner.  The patient/ surrogate has been notified that other healthcare professionals (including students, residents and Engineer, maintenance) may be involved in this audio-video evaluation.   All laws concerning confidentiality and patient access to medical records and copies of medical records apply to telemedicine.  The patient/ surrogate has received the [place] Notice of Privacy Practices.  I  have reviewed this above verification and consent paragraph with the patient/ surrogate.  If the patient is not capacitated to understand the above, and no surrogate is available, since this is not an emergency evaluation, the visit will be rescheduled until such time that the patient can consent, or the surrogate is available to consent.    Demographics:   Medical Record #: [id number]   Date: October 08, 2019   Patient Name: Angelica Wade   DOB: 03-09-1986  Age: 34 year old  Sex: female  Location: Home address on file    Evaluator(s):   Angelica Wade was evaluated by me today.    Clinic Location:  [facility]   [facility]  [address]  [place] [address]    Patient is unable to be seen face-to-face in clinic at this time because: Due to COVID-19 pandemic and a federally declared state of public health emergency, this service is being conducted via video in order to reduce exposure for staff and patients.      HPI by Problem:     #Headaches/migraines:  Has been having chronic migraines for many years  Before were every so often  Recently have been getting more frequent, now most days  Seemed to get worse since having her son 5 years, particularly worse over the past few months  Sometimes a couple of hours, sometimes a full day or 2-3 days at a time  Frontal, bulateral  Will get dizzy  Throbbing, pulsing  Nausea, sometimes vomiting  Photophobia  Worse with exertion  Has tried tylenol, Excedrin, ibuprofen, none of it seems to work  No  known triggers  Usually more in the morning, but not always  Sometimes will wake up with headache    Patient does have a mild headache at this time.    DESCRIPTION OF HEADACHES:  Character & Location of pain: bilateral in the frontal area  Radiation: no  Severity of pain: up to 10  Accompanying symptoms: nausea, vomiting and photophobia  Rapidity of onset: rapid  Typical duration of individual headache: couple hours up to 2-3  days  Are most headaches similar in presentation? yes  Relief from headache: unable to obtain relief with OTC meds    TYPICAL PRECIPITANTS OF HEADACHE: patient is aware of none    TEMPORAL PATTERN OF HEADACHES:  Pt started having HA's many years ago  Worst time of day: varies  Awaken from sleep?: yes - only occasionally  Seasonal pattern?: no  "Clustering" of HA's over time? no  Overall pattern since problem began: gradually worsening    DEGREE OF FUNCTIONAL IMPAIRMENT: moderate    CURRENT USE OF MEDS TO TREAT HA: NSAIDs, acetaminophen  Daily use? no  Prior medications tried: none    ADDITIONAL RELEVANT HISTORY:  History of head/neck trauma? no  History of head/neck surgery? no  Family h/o headache problems? yes - mom  Use of meds that might worsen HA's (nitrates, exogenous estrogens, nifedipine)? no  Exposure to carbon monoxide? no  Neurologic ROS: dizziness  Psychiatric ROS: negative  ENT ROS: negative          Review of Systems:   As per HPI, otherwise denies fevers, chills, cough, SOB      Current Outpatient Medications   Medication Sig Dispense Refill   . cyclobenzaprine (FLEXERIL) 10 MG tablet Take 1 tablet (10 mg) by mouth every 8 hours as needed for Muscle Spasms. 30 tablet 0   . fluticasone propionate (FLONASE) 50 MCG/ACT nasal spray Spray 1 spray into each nostril daily. 1 bottle 3   . gabapentin (NEURONTIN) 100 MG capsule Take 1 capsule (100 mg) by mouth 3 times daily as needed (Back pain). 90 capsule 0   . HYDROcodone-acetaminophen (NORCO) 5-325 MG tablet Take 1 tablet by mouth every 6 hours as needed for Moderate Pain (Pain Score 4-6). 12 tablet 0   . lamoTRIgine (LAMICTAL) 25 MG tablet Take 1 tablet (25 mg) by mouth daily. 30 tablet 0   . meloxicam (MOBIC) 15 MG tablet Take 1 tablet (15 mg) by mouth daily. 30 tablet 0   . oxymetazoline (AFRIN) 0.05 % nasal spray Spray 2 sprays into each nostril 2 times daily. Recommend limiting use to three days only 1 bottle 0   . rizatriptan (MAXALT) 10 MG tablet  Take 1 tablet (10 mg) by mouth once as needed for Migraine for up to 1 dose. May repeat in 2 hours in needed 10 tablet 1   . traZODone (DESYREL) 50 MG tablet Trazodone 25-50 mg (half a tab or one whole tab) PO qHS PRN for help with sleep 30 tablet 1     No current facility-administered medications for this visit.      Allergies   Allergen Reactions   . Shellfish-Derived Products Swelling     Patient Active Problem List   Diagnosis   . Unspecified mood (affective) disorder (CMS-HCC)   . Chronic low back pain   . Allergic rhinitis   . Insomnia   . Anxiety   . Nexplanon in place     Past Medical History:   Diagnosis Date   . Allergic rhinitis    .  Bipolar affective disorder (CMS-HCC)    . Chronic low back pain      Past Surgical History:   Procedure Laterality Date   . NO PAST SURGERIES       Social History     Tobacco Use   . Smoking status: Current Some Day Smoker     Types: Cigarettes, Electronic Cigarette (Smokeless)   . Smokeless tobacco: Current User   . Tobacco comment: former cigarettes, vapes currently   Substance Use Topics   . Alcohol use: Yes     Frequency: Monthly or less   . Drug use: Never       OBJECTIVE:  Physical Exam: (limited to visualization over video)  GENERAL: NAD, comfortable, WDWN  RESP:  Unlabored breathing  PSYCH: normal affect  NEURO:  alert & oriented. CN 2-12 grossly intact. Moving all extremities normally. Reports normal sensation throughout.   SKIN: Intact, no rashes or erythema, no open wounds       ASSESSMENT & PLAN:  Angelica Wade was seen today for migraine.    Diagnoses and all orders for this visit:    Migraine with aura and without status migrainosus, not intractable  Headache symptoms classic for migraine type headache, which she has had for many years but has had worsening over time, now having migraines almost daily. Has only been trying OTC medications, never had prescription medications for this. Discussed treatment options, including abortive and preventative medications. Will  start triptan today for abortive, instructed she can take NSAIDs along with this if needed. Keep headache diary and follow up in 2 weeks, if continues to have same frequency of migraines then would consider starting preventative. Will also order MRI brain due to change in headache pattern and severity to rule out intracranial process. ED precautions for severe acute change in headache, worst headache of life, thunderclap headache, or new neurological findings.   -     rizatriptan (MAXALT) 10 MG tablet; Take 1 tablet (10 mg) by mouth once as needed for Migraine for up to 1 dose. May repeat in 2 hours in needed  -     MRI Brain W/O Contrast; Future      Return in about 2 weeks (around 10/22/2019), or if symptoms worsen or fail to improve.    Plan discussed with pt including risks/benefits/alternatives including watchful waiting.  Informed pt of 24/7 on call MD.  ED if acutely worsening after hours.  Pt verbalized understanding.    Medications reviewed with patient and medication list reconciled.  Over the counter medications, herbal therapies and supplements reviewed.  Patient's understanding and response to medications assessed.    Barriers to medications assessed and addressed.  Risks, benefits, alternatives to medications reviewed.    No barriers to learning, verbalizes understanding of teaching and instructions.      Junita Push, MD  [facility]  [address] [place], [address]  Phone: [phone]  Fax: [phone]  Pager: [phone]

## 2020-01-26 ENCOUNTER — Telehealth (INDEPENDENT_AMBULATORY_CARE_PROVIDER_SITE_OTHER): Payer: Self-pay | Admitting: Sports Medicine

## 2020-01-26 ENCOUNTER — Ambulatory Visit (INDEPENDENT_AMBULATORY_CARE_PROVIDER_SITE_OTHER): Payer: TRICARE Prime—HMO | Admitting: Family Medicine

## 2020-01-26 ENCOUNTER — Encounter (INDEPENDENT_AMBULATORY_CARE_PROVIDER_SITE_OTHER): Payer: Self-pay | Admitting: Family Medicine

## 2020-01-26 ENCOUNTER — Encounter (INDEPENDENT_AMBULATORY_CARE_PROVIDER_SITE_OTHER): Payer: Self-pay | Admitting: Sports Medicine

## 2020-01-26 VITALS — BP 117/76 | HR 72 | Temp 97.0°F | Wt 189.0 lb

## 2020-01-26 DIAGNOSIS — M5441 Lumbago with sciatica, right side: Secondary | ICD-10-CM

## 2020-01-26 DIAGNOSIS — M5440 Lumbago with sciatica, unspecified side: Secondary | ICD-10-CM

## 2020-01-26 MED ORDER — KETOROLAC TROMETHAMINE 30 MG/ML IJ SOLN
30.0000 mg | Freq: Once | INTRAMUSCULAR | Status: AC
Start: 2020-01-26 — End: 2020-01-26
  Administered 2020-01-26: 30 mg via INTRAMUSCULAR

## 2020-01-26 MED ORDER — NAPROXEN 375 MG OR TABS
375.0000 mg | ORAL_TABLET | Freq: Two times a day (BID) | ORAL | 0 refills | Status: DC
Start: 2020-01-26 — End: 2020-02-18

## 2020-01-26 MED ORDER — CYCLOBENZAPRINE HCL 10 MG OR TABS
10.0000 mg | ORAL_TABLET | Freq: Three times a day (TID) | ORAL | 0 refills | Status: DC | PRN
Start: 2020-01-26 — End: 2020-02-18

## 2020-01-26 NOTE — Interdisciplinary (Signed)
Pre-visit chart review and huddle completed with staff and physician.    Outstanding labs, imaging and consults reviewed and identified.    Health maintanence issues identified and addressed:    Health Maintenance   Topic Date Due    Pneumococcal 0-64 yrs (1 of 2 - PPSV23) Never done    COVID-19 Vaccine (2 - Pfizer 2-dose series) 02/11/2020    Cervical Cancer Screening  09/03/2020    PHQ2 depression screen  10/07/2020    Tetanus (3 - Td or Tdap) 03/03/2028    Influenza  Completed    Polio Vaccine  Aged Out    HPV Vaccine <= 26 Yrs  Aged Out    Meningococcal MCV4 Vaccine  Aged Out

## 2020-01-26 NOTE — Telephone Encounter (Signed)
From: [name]  To: Laurence Compton, MD  Sent: 01/26/2020 8:43 AM PDT  Subject: 1-Non Urgent Medical Advice    Hi good morning. I'm finding myself not being able to walk due to my back it really feels like something is pinching something in my lower back and going down my right leg to cause me pain to walk it hurts really bad and I dont know what else to do I did stretch's and hot shower to loosen my back it still hurts kinda feels like my sciatic nerve .

## 2020-01-26 NOTE — Telephone Encounter (Signed)
Action taken by Care Assist MA:   Creating symptom telephone encounter per current protocol.   Please refer to symptom telephone encounter created 01/26/2020.  Closing MyChart encounter.    Encounter created by Care Assist MA.

## 2020-01-26 NOTE — Progress Notes (Signed)
PCP: Donzetta Matters    SUBJECTIVE:  Angelica Wade is a 34 year old female who presents for evaluation of severe lower back pain x 3 days  Onset Saturday after snorkeling, can not remember any unusual movement, fell sore back and on Sunday was cleaning house: lost of bending, vacuuming, etc.  Pain worsen by night and yesterday she had severe pain with all movement: bending, twisting, sitting, standing and walking.  She can not identify trauma    Pain is 7/10 feels like "shooting pain from R side to gluteal area and lateral aspect of her R leg.    Denies paresthesias in leg, ? Weakness on R leg but she thinks it is due to pain  Denies all of the following:  F/C, IVDA, saddle anesthesia, tingling and numbness on legs, urinary/bowel retention or incontinence    She reports having this problems every few years, last time was 3 years ago  No known trauma in past.  However, had X-rays and was told by chiropractor that she had "abnormal discs" about 5 years ago    Has taken Tylenol without much relief.  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  Has helps in evening and night    Patient Active Problem List   Diagnosis    Unspecified mood (affective) disorder (CMS-HCC)    Chronic low back pain    Allergic rhinitis    Insomnia    Anxiety    Nexplanon in place       OBJECTIVE:    Blood pressure 117/76, pulse 72, temperature 97 F (36.1 C), weight 85.7 kg (189 lb).    Obese F appears in distress due to pain.  Looks very uncomfortable    She was not able to lie down for exam  Pain with all ranges of motion,  No deformity of back    + TTP over T12 to L4 and gluteal area R side.  + TTP sciatic notch                     L side minimal TTP over paraspinal muscles  Lower extrem:  + slump test: bend neck and raised lag on R side                            - slump test on L  Able to lift legs, adduction and adduction.  Normal sensation bilaterally.  DTR 3+ symm      ASSESSMENT/PLAN:    1.  LBP  with R sided radiculopathy.  Strain and muscle spasms  Toradol IM 30 mg  Start Naprosyn tomorrow bid with food.  Flexeril only qhs for 7 days  Ice/heat/  bengay patches,  Gentle stretching   X-ray requested to assess disc and vertebral bodies  Consider PT if no improvement    Follow Dr Donzetta Matters

## 2020-01-26 NOTE — Telephone Encounter (Signed)
Mychart symptom message:    "From: [name]  To: Laurence Compton, MD  Sent: 01/26/2020 8:43 AM PDT  Subject: 1-Non Urgent Medical Advice    Hi good morning. I'm finding myself not being able to walk due to my back it really feels like something is pinching something in my lower back and going down my right leg to cause me pain to walk it hurts really bad and I dont know what else to do I did stretch's and hot shower to loosen my back it still hurts kinda feels like my sciatic nerve ."    Action taken by Care Assist MA:     [facility] agent contacted pt and secured appt with Dr. Sharen Hones for 01/26/2020 at 4:30 pm.  Forwarding encounter to MA/Nurse pool for assistance if applicable.    Encounter created by Care Assist MA.

## 2020-01-28 ENCOUNTER — Ambulatory Visit (INDEPENDENT_AMBULATORY_CARE_PROVIDER_SITE_OTHER): Payer: Self-pay | Admitting: Sports Medicine

## 2020-01-28 NOTE — Telephone Encounter (Signed)
Symptom Call          Next office visit:  Visit date not found  List the date of PCP first available: N/a   Did you offer Express Care/Urgent Care:  No    What symptom is the patient experiencing? She has lower back pain that gets worse everyday.   Shes gotten an injection, muscle relaxer, and naproxen (Monday). She feels like the pain killers are not working, it's hard to get out of bed. Starting last night, she couldn't sleep because the pain was so bad.       Name of PCP Provider: Laurence Compton   Insurance Coverage Verified: Active- in network  Last office visit: 10/08/2019    Who is reporting the symptoms? Incoming call from patient    Is this a new or ongoing symptom? new  Estimated time since experiencing symptom(s)? Saturday     Best way to contact patient: [phone] (mobile)   Alternative communication method: (other) mychart

## 2020-01-28 NOTE — Telephone Encounter (Signed)
PROVIDER ACTION REQUESTED: No, FYI only   Action item needed:  None  Appt scheduled: No    Chief Complaint  Chronic low back pain.  Assessment details:  10/10 pain level and patient unable to walk, with pain and weakness radiating down to bilateral legs. Patient has not urinated for 15 hours since she is not able to walk to the bathroom. Denies fever, numbness, abdominal pain.    CLINIC/RN/LVN ACTION REQUEST: NO, FYI Only  Action item needed: NO, FYI Only    Patient is advised to go to the ER via ambulance. Patient agrees to disposition, however will need to wait for her spouse to come home to watch their kids.     COVID TRIAGE PROTOCOL: not discussed.  Symptoms w/in the last 14 days: fever or chills, cough, sob/diff breathing, fatigue, muscle or body aches, headache, loss of taste or smell, sore throat, congestion or runny nose, nausea and vomiting, diarrhea.   Questions:   1. Looking back to the last 14 days, have you been informed by a public health agency or a healthcare agency or a healthcare system that you have been exposed to [place] (COVID-19)  2. Looking back to the last 14 days, has a person in your household been diagnosed with Coronavirus (COVID-19) infection       Reason for Call: Back Pain     Disposition: Go to ED Now       Reason for Disposition   Weakness of a leg or foot (e.g., unable to bear weight, dragging foot)   Unable to walk    Additional Information   Negative: Passed out (i.e., lost consciousness, collapsed and was not responding)   Negative: Shock suspected (e.g., cold/pale/clammy skin, too weak to stand, low BP, rapid pulse)   Negative: Sounds like a life-threatening emergency to the triager   Negative: Major injury to the back (e.g., MVA, fall > 10 feet or 3 meters, penetrating injury, etc.)   Negative: Followed a tailbone injury   Negative: [1] Pain in the upper back over the ribs (rib cage) AND [2] radiates (travels, goes) into chest   Negative: [1] Pain in the upper  back over the ribs (rib cage) AND [2] worsened by coughing (or clearly increases with breathing)   Negative: Back pain during pregnancy   Negative: Pain mainly in flank (i.e., in the side, over the lower ribs or just below the ribs)   Negative: [1] SEVERE back pain (e.g., excruciating) AND [2] sudden onset AND [3] age > 68   Negative: [1] Unable to urinate (or only a few drops) > 4 hours AND [2] bladder feels very full (e.g., palpable bladder or strong urge to urinate)   Negative: [1] Loss of bladder or bowel control (urine or bowel incontinence; wetting self, leaking stool) AND [2] new onset   Negative: Numbness in groin or rectal area (i.e., loss of sensation)   Negative: [1] SEVERE abdominal pain AND [2] present > 1 hour   Negative: [1] Abdominal pain AND [2] age > 80    Answer Assessment - Initial Assessment Questions  1. ONSET: "When did the pain begin?"       Chronic pain.   2. LOCATION: "Where does it hurt?" (upper, mid or lower back)      Lower back.   3. SEVERITY: "How bad is the pain?"  (e.g., Scale 1-10; mild, moderate, or severe)    - MILD (1-3): doesn't interfere with normal activities     - MODERATE (4-7):  interferes with normal activities or awakens from sleep     - SEVERE (8-10): excruciating pain, unable to do any normal activities       Severe pain. 10/10 level.  4. PATTERN: "Is the pain constant?" (e.g., yes, no; constant, intermittent)       Intermittent pain. Pain is there with any movement.  5. RADIATION: "Does the pain shoot into your legs or elsewhere?"      Pain going down to both legs.  6. CAUSE:  "What do you think is causing the back pain?"       Degenerative disk disease.   7. BACK OVERUSE:  "Any recent lifting of heavy objects, strenuous work or exercise?"      4 days ago was cleaning her garage.   8. MEDICATIONS: "What have you taken so far for the pain?" (e.g., nothing, acetaminophen, NSAIDS)      Naproxen, Flexeril. No relief. Was given an injection to her back in PCP clinic.    9. NEUROLOGIC SYMPTOMS: "Do you have any weakness, numbness, or problems with bowel/bladder control?"      Weakness of the legs. Last urination was last night around 6-7 PM.  10. OTHER SYMPTOMS: "Do you have any other symptoms?" (e.g., fever, abdominal pain, burning with urination, blood in urine)       Denies.    Protocols used: BACK PAIN-A-AH

## 2020-02-17 ENCOUNTER — Ambulatory Visit
Admission: RE | Admit: 2020-02-17 | Discharge: 2020-02-17 | Disposition: A | Discharge status: Home / Self Care | Payer: TRICARE Prime—HMO | Attending: Family Medicine | Admitting: Family Medicine

## 2020-02-17 DIAGNOSIS — M5416 Radiculopathy, lumbar region: Secondary | ICD-10-CM

## 2020-02-17 DIAGNOSIS — M5440 Lumbago with sciatica, unspecified side: Secondary | ICD-10-CM | POA: Insufficient documentation

## 2020-02-18 ENCOUNTER — Encounter (INDEPENDENT_AMBULATORY_CARE_PROVIDER_SITE_OTHER): Payer: Self-pay | Admitting: Family Medicine

## 2020-02-18 ENCOUNTER — Ambulatory Visit (INDEPENDENT_AMBULATORY_CARE_PROVIDER_SITE_OTHER): Payer: TRICARE Prime—HMO | Admitting: Family Medicine

## 2020-02-18 VITALS — BP 121/78 | HR 94 | Temp 96.1°F | Ht 63.0 in | Wt 190.0 lb

## 2020-02-18 DIAGNOSIS — M5440 Lumbago with sciatica, unspecified side: Secondary | ICD-10-CM

## 2020-02-18 DIAGNOSIS — M5442 Lumbago with sciatica, left side: Secondary | ICD-10-CM

## 2020-02-18 MED ORDER — GABAPENTIN 300 MG OR CAPS
300.0000 mg | ORAL_CAPSULE | Freq: Three times a day (TID) | ORAL | 0 refills | Status: DC
Start: 2020-02-18 — End: 2020-03-24

## 2020-02-18 MED ORDER — NAPROXEN 375 MG OR TABS
375.0000 mg | ORAL_TABLET | Freq: Two times a day (BID) | ORAL | 0 refills | Status: DC
Start: 2020-02-18 — End: 2020-03-24

## 2020-02-18 NOTE — Interdisciplinary (Signed)
Pre-visit chart review and huddle completed with staff and physician.    Outstanding labs, imaging and consults reviewed and identified.    Health maintanence issues identified and addressed:    Health Maintenance   Topic Date Due    Pneumococcal Vaccine (1 of 2 - PPSV23) Never done    COVID-19 Vaccine (2 - Pfizer 2-dose series) 02/11/2020    Cervical Cancer Screening  09/03/2020    PHQ2 depression screen  01/25/2021    Tetanus (3 - Td or Tdap) 03/03/2028    Influenza  Completed    Polio Vaccine  Aged Out    HPV Vaccine <= 26 Yrs  Aged Out    Meningococcal MCV4 Vaccine  Aged Out

## 2020-02-18 NOTE — Patient Instructions (Signed)
PLease continue Naproxec twice a day with food    Start Gabapentin 300 mg at night time, then twice a day and see how it goes.  May increase to 3 times a day    Stop Flexeril    Use: ice/heat,  voltaren gel is over counter      Follow up Dr Donzetta Matters in 2 weeks

## 2020-02-18 NOTE — Progress Notes (Signed)
PCP: Donzetta Matters    SUBJECTIVE:    Angelica Wade is a 34 year old female here for follow up on LBP.  Seen by me on May 25 after presenting with new onset of LBP pain radiating to  L leg.  Onset day after snorkeling,   Had no h/o trauma and no unusual physical activity.   Her exam was significant for LS TTP and possible L sided radiculopathy.  Otherwise, normal Strength, sensation, pulses and reflexes.  She was given Naprosyn bid and Flexeril. Recommended ice, heat, gentle exercises    She went to ED [facility] on 5/28 due to "10/10 pain and being able to move"and told no unusual physical findings.  Was given 7 Norco and told to follow up with PCP    X-rays requested by me were normal    Patient states that she has had this pain for years, her back gets tight, now she feels it more in mid thoracic area, described as "pressure like".  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  She used to take gabapentin 100 mg and that helped  She stop Flexeril, it does not work  Naprosyn helps, she wants refill today    Reviewing her meds: she no longer takes Lamictal, Trazodone, and flexeril      Patient Active Problem List   Diagnosis    Unspecified mood (affective) disorder (CMS-HCC)    Chronic low back pain    Allergic rhinitis    Insomnia    Anxiety    Nexplanon in place       OBJECTIVE:    Blood pressure 121/78, pulse 94, temperature 96.1 F (35.6 C), height 5\' 3"  (1.6 m), weight 86.2 kg (190 lb).    Obese F   She seems better than last visit.  Sitting in chair.  Observe her walking as leaving the clinic.  Describes pain with all ranges of motion,  No deformity of back    + TTP over T12 to L4 and gluteal area R side.  + TTP sciatic notch                     L side minimal TTP over paraspinal muscle      ASSESSMENT/PLAN:    1.  LBP with L sided radiculopathy.   Strain and muscle spasms  Continue Naprosyn bid.  Stop Flexeril and start Gabapentin, will titrate up.  Start only qhs and then bid.  May increase to tid only if no improvement in 2 weeks time  Ice/heat/  bengay patches,  Gentle stretching    Discussed no opioids,  Explained that this medication are not the best for LBP and have high risk for adverse side effects and dependence.  Also makes back pain worsen (allodynia)    Strongly consider PT    Follow up Dr in 2 weeks

## 2020-02-29 ENCOUNTER — Encounter (INDEPENDENT_AMBULATORY_CARE_PROVIDER_SITE_OTHER): Payer: Self-pay | Admitting: Sports Medicine

## 2020-02-29 ENCOUNTER — Ambulatory Visit (INDEPENDENT_AMBULATORY_CARE_PROVIDER_SITE_OTHER): Payer: TRICARE Prime—HMO | Admitting: Sports Medicine

## 2020-02-29 VITALS — BP 124/72 | HR 86 | Temp 97.8°F | Wt 189.0 lb

## 2020-02-29 DIAGNOSIS — M5441 Lumbago with sciatica, right side: Secondary | ICD-10-CM

## 2020-02-29 NOTE — Interdisciplinary (Signed)
Pre-visit chart review and huddle completed with staff and physician.    Outstanding labs, imaging and consults reviewed and identified.    Health maintanence issues identified and addressed:    Health Maintenance   Topic Date Due    Pneumococcal Vaccine (1 of 2 - PPSV23) Never done    COVID-19 Vaccine (2 - Pfizer 2-dose series) 02/11/2020    Cervical Cancer Screening  09/03/2020    PHQ2 depression screen  02/17/2021    Tetanus (3 - Td or Tdap) 03/03/2028    Influenza  Completed    Polio Vaccine  Aged Out    HPV Vaccine <= 26 Yrs  Aged Out    Meningococcal MCV4 Vaccine  Aged Out

## 2020-02-29 NOTE — Patient Instructions (Addendum)
Low Back Pain  Exercise Program   Access Code: [email or web address]  Patient Portal: [email or web address]    What can cause low back injuries?       Many things can cause low back injuries, such as muscle strain or spasm, sprains of ligaments (which attach bone to bone), joint problems or a "slipped disk." The most common cause is using your back muscles in activities you're not used to, such as lifting heavy furniture or doing yard work. Unexpected events such as taking a fall or a car accident can also cause low back pain.    A slipped disk (also called a herniated disk) happens when a disk between the bones of the spine bulges and presses on nerves. This is often caused by twisting while lifting. Many people who have a slipped disk don't know what caused it. In most cases, slipped disks and other low back pain can be relieved by following a few simple methods.  What can I do for relief when I've hurt my lower back?       The best position for relief when your back hurts is to lie on your back on the floor with pillows under your knees, with your hips and knees bent and your feet on a chair, or just with your hips and knees bent. This takes the pressure and weight off your back.    You may need 1 to 2 days of this sort of rest for a hurt back. Resting longer than this can cause your muscles to weaken, which can slow your recovery. Even if it hurts, walk around for a few minutes every hour to help keep your back muscles strong.  Return to top  What else can I do for relief?  Heating pads can help to relax painful muscle spasms. Use heat for 20 to 30 minutes at a time. Ice packs and massages may also give relief.     Nonprescription medicines that reduce pain and/or swelling include aspirin, acetaminophen (brand name: Tylenol), naproxen (brand name: Aleve) and ibuprofen (some brand names: Advil, Motrin).  Return to top  Call your family doctor if:  • Pain goes down your leg below your knee   • Your leg, foot, groin or  rectal area feels numb   • You have fever, nausea, vomiting, abdominal pain, weakness or sweating   • You lose control over going to the bathroom   • Your pain was caused by an injury   • Your pain is so intense you can't move around   • Your pain doesn't seem to be getting better after 2 to 3 weeks   Return to top  Is there relief for ongoing back problems?  Treatment of ongoing back problems must be directed at the cause. This may mean losing weight (because being overweight can make back pain worse), exercising and improving your posture when you're sitting, standing and sleeping.  Return to top  Tips for preventing back strain  • Don't lift by bending over. Lift an object by bending your knees and squatting to pick up the object. Keep your back straight and hold the object close to your body. Avoid twisting your body while lifting.   • Push rather than pull when you must move heavy objects.   • If you must sit at your desk or at the wheel of a car or truck for long hours, break up the time with stops to stretch.   • Wear flat shoes or shoes with   low heels (1 inch or lower).   • Exercise regularly. An inactive lifestyle contributes to low back pain.   Return to top  What's the best way to sit?  Sit in chairs with straight backs or low-back support. Keep your knees a little higher than your hips. Adjust the seat or use a low stool to prop up your feet. Turn by moving your whole body rather than by twisting at your waist.     When driving, sit straight and move the seat forward. This helps you not lean forward to reach the controls. You may want to put a small pillow or rolled towel behind your lower back if you must drive or sit for a long time.  Return to top  What's the best position for standing?       If you must stand for long periods, rest 1 foot on a low stool to relieve pressure on your lower back. Every 5 to 15 minutes, switch the foot you're resting on the stool. Maintain good posture: Keep your ears,  shoulders and hips in a straight line, with your head up and your stomach pulled in.  Return to top  What's the best position for sleeping?  The best way to sleep is on your side with your knees bent. You may put a pillow under your head to support your neck. You may also put a pillow between your knees.     If you sleep on your back, put pillows under your knees and a small pillow under your lower back. Don't sleep on your stomach unless you put a pillow under your hips.     Use a firm mattress. If your mattress is too soft, use a board of 1/2-inch plywood under the mattress to add support.  Return to top  What exercises can I do to strengthen my back?       Some specific exercises can help your back. To gently stretch your back muscles, lie on your back with your knees bent and slowly raise your left knee to your chest. Press your lower back against the floor. Hold for 5 seconds. Relax and repeat the exercise with your right knee. Do 10 of these exercises for each leg, switching legs.    While some exercises are specific for your back, it's also important to stay active in general. Swimming and walking are good exercises to improve your overall fitness.      Return to top

## 2020-02-29 NOTE — Progress Notes (Signed)
Family Medicine Clinic Note    Chief Complaint   Patient presents with    Back Pain     2 week follow up     History of Present Illness      Angelica Wade is a 34 year old female who presents to clinic for:    #Follow up back pain:  Seen two times by Dr. Sharen Wade over the past month due to acute flare of her chronic back pain  Overall doing okay today, although didn't sleep very well last night due to whole back was hurting  Has been trying heating pad or ice packs  Last night tried doubling up her gabapentin to 600 mg, was a little more helpful  Has to sleep at incline to help with symptoms. Hurts significantly if she lays completely flat  Pain in low back, primarily right sided with right sided sciatica, has had left in the past as well but not currently  Flared up when she was cleaning out her garage, was very sore the next day. Went to ED due to severity of pain, had to have neighbor help get herself dressed and down to the car due to sciatica pain  Has done PT in the past, has had back injections as well in the past  Does home stretching and yoga at home      Per Dr. Sharen Wade note 02/18/20:  Angelica Wade is a 34 year old female here for follow up on LBP.  Seen by me on May 25 after presenting with new onset of LBP pain radiating to  L leg.  Onset day after snorkeling,   Had no h/o trauma and no unusual physical activity.   Her exam was significant for LS TTP and possible L sided radiculopathy.  Otherwise, normal Strength, sensation, pulses and reflexes.  She was given Naprosyn bid and Flexeril. Recommended ice, heat, gentle exercises    She went to ED [facility] on 5/28 due to "10/10 pain and being able to move"and told no unusual physical findings.  Was given 7 Norco and told to follow up with PCP    X-rays requested by me were normal    Patient states that she has had this pain for years, her back gets tight, now she feels it more in mid thoracic area, described as "pressure like".  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  She used to take gabapentin 100 mg and that helped  She stop Flexeril, it does not work  Naprosyn helps, she wants refill today    Reviewing her meds: she no longer takes Lamictal, Trazodone, and flexeril    Review of Systems     As per HPI, otherwise denies fevers, chills, cough, SOB    Past History     Current Outpatient Medications   Medication Sig Dispense Refill    fluticasone propionate (FLONASE) 50 MCG/ACT nasal spray Spray 1 spray into each nostril daily. 1 bottle 3    gabapentin (NEURONTIN) 300 MG capsule Take 1 capsule (300 mg) by mouth 3 times daily. Start 300 mg at niht time for 5 days, then increase to twice a day.  See how are you doing  If it is painful, increase to 3 times a day 90 capsule 0  naproxen (NAPROSYN) 375 MG tablet Take 1 tablet (375 mg) by mouth 2 times daily (with meals). 40 tablet 0    oxymetazoline (AFRIN) 0.05 % nasal spray Spray 2 sprays into each nostril 2 times daily. Recommend limiting use to three days only 1 bottle 0    rizatriptan (MAXALT) 10 MG tablet Take 1 tablet (10 mg) by mouth once as needed for Migraine for up to 1 dose. May repeat in 2 hours in needed 10 tablet 1     No current facility-administered medications for this visit.     Allergies   Allergen Reactions    Shellfish-Derived Products Swelling     Patient Active Problem List   Diagnosis    Unspecified mood (affective) disorder (CMS-HCC)    Chronic low back pain    Allergic rhinitis    Insomnia    Anxiety    Nexplanon in place     Past Medical History:   Diagnosis Date    Allergic rhinitis     Bipolar affective disorder (CMS-HCC)     Chronic low back pain      Past Surgical History:   Procedure Laterality Date    NO PAST SURGERIES       Social History     Tobacco Use    Smoking status: Current Some Day Smoker      Types: Cigarettes, Electronic Cigarette (Smokeless)    Smokeless tobacco: Current User    Tobacco comment: former cigarettes, vapes currently   Substance Use Topics    Alcohol use: Yes    Drug use: Never       Physical Exam     BP 124/72 (BP Location: Left arm, BP Patient Position: Sitting, BP cuff size: Regular)    Pulse 86    Temp 97.8 F (36.6 C) (Temporal)    Wt 85.7 kg (189 lb)    SpO2 97%    BMI 33.48 kg/m  Body mass index is 33.48 kg/m.    GENERAL: NAD, comfortable, WDWN  RESP:  Unlabored breathing  PSYCH: normal affect  NEURO:  alert & oriented  SKIN: Intact, no rashes or erythema, no open wounds  MSK:  LUMBAR SPINE  INSPECTION:   No gross deformity, no step-offs noted, no asymmetry  PALPATION:   + midline tenderness to palpation  + paraspinal muscle tenderness to palpation on right  + SI joint tenderness to palpation on right  ROM:   Full flexion but with pain  Full extension  Full rotation with pain to the right  STRENGTH TESTING:   5/5 muscle strength hip flexion/adduction/abduction  5/5 muscle strength knee flexion/extension  5/5 muscle strength ankle dorsiflexion/plantarflexion  5/5 muscle strength toe flexion/extension  NEUROVASCULAR:   sensation to light touch intact throughout bilateral lower extremities  DTRs 2+ and symmetric patella and Achilles  Gait normal  SPECIAL TESTS:  + slump test on right      Data Review (personally reviewed)     Imaging:   X-Ray Lumbosacral Spine 2 Or 3 Views  Result Date: 02/18/2020  IMPRESSION: Normal lumbar spine.        Assessment/Plan     [name] was seen today for back pain.    Diagnoses and all orders for this visit:    Acute left-sided low back pain with right-sided sciatica  Has continued low back pain, worse on right side with sciatica. X-rays reviewed and are normal, however does have lumbar radiculopathy and has failed conservative treatment. Will evaluate further with MRI at this  time. Home exercise program given to patient today to continue in the  meantime, then will do video visit to review MRI results and next steps in management once complete.   -     MRI Lumbar Spine W/O Contrast; Future      Follow up  Return if symptoms worsen or fail to improve, for after MRI.    Medication Review:  Medications reviewed with patient and medication list reconciled.  Over the counter medications, herbal therapies and supplements reviewed.  Patient's understanding and response to medications assessed.   Barriers to medications assessed and addressed.   Risks, benefits, alternatives to medications reviewed.    No barriers to learning, verbalizes understanding of teaching and instructions.      Nigel Sloop, MD  [facility]  Pager [phone]

## 2020-03-24 ENCOUNTER — Other Ambulatory Visit (INDEPENDENT_AMBULATORY_CARE_PROVIDER_SITE_OTHER): Payer: Self-pay | Admitting: Sports Medicine

## 2020-03-24 DIAGNOSIS — M5440 Lumbago with sciatica, unspecified side: Secondary | ICD-10-CM

## 2020-03-24 NOTE — Telephone Encounter (Signed)
Incoming call from patient requesting refill  Medication requested:   Requested Prescriptions     Pending Prescriptions Disp Refills    gabapentin (NEURONTIN) 300 MG capsule 90 capsule 0     Sig: Take 1 capsule (300 mg) by mouth 3 times daily. Start 300 mg at niht time for 5 days, then increase to twice a day.  See how are you doing  If it is painful, increase to 3 times a day    naproxen (NAPROSYN) 375 MG tablet 40 tablet 0     Sig: Take 1 tablet (375 mg) by mouth 2 times daily (with meals).       Last refill: 02/18/2020  Pharmacy:     [facility] [address] [place], [place] - [address]  [address] [place] [address]  Phone: [phone] Fax: [phone]      Last visit in this department 02/29/2020  Next visit in this department Visit date not found    Last date of urine immunoassay drug screen       Last date of CURES report                        Prescription refill pended and routed to PCP for review/approval if appropriate.

## 2020-03-24 NOTE — Telephone Encounter (Signed)
 [facility]     Gabapentin and naproxen are not currently included in the Pharmacy Refill Clinic protocols. Re-routing to the responsible staff for processing.  Thank you    Pt advised medication may not be able to be prescribed in out of state.  Pt currently in [place]  Pt requesting call back with confirmation if refills could be sent or not

## 2020-03-24 NOTE — Telephone Encounter (Signed)
Incoming call from patient requesting refill     Pt advised medication may not be able to be prescribed in out of state.  Pt currently in [place]  Pt requesting call back with confirmation if refills could be sent or not      Established with: Laurence Compton   Last OV with PCP: 02/29/2020   Next OV with PCP: Visit date not found   Last OV in department: 02/29/2020   Next OV in department: Visit date not found    Requested Medication(s):  Requested Prescriptions     Pending Prescriptions Disp Refills    naproxen (NAPROSYN) 375 MG tablet 40 tablet 0     Sig: Take 1 tablet (375 mg) by mouth 2 times daily (with meals).    gabapentin (NEURONTIN) 300 MG capsule 90 capsule 0     Sig: Take 1 capsule (300 mg) by mouth 3 times daily. Start 300 mg at niht time for 5 days, then increase to twice a day.  See how are you doing  If it is painful, increase to 3 times a day     Allergies   Allergen Reactions    Shellfish-Derived Products Swelling        Send to:       [id number] - [place] - [address]  [address]  [place] [address]  Phone: [phone] Fax: [phone]       Last labs:   No results found for: CHOL, HDL, LDLCALC, TRIG, LDLDIRECT, TSH, A1C   Blood Pressure   02/29/20 124/72   02/18/20 121/78   01/26/20 117/76      Health Maintenance Due   Topic Date Due    Pneumococcal Vaccine (1 of 2 - PPSV23) Never done    COVID-19 Vaccine (2 - Pfizer 2-dose series) 02/11/2020        Current Medication(s):   Current Outpatient Medications   Medication Sig Dispense Refill    fluticasone propionate (FLONASE) 50 MCG/ACT nasal spray Spray 1 spray into each nostril daily. 1 bottle 3    gabapentin (NEURONTIN) 300 MG capsule Take 1 capsule (300 mg) by mouth 3 times daily. Start 300 mg at niht time for 5 days, then increase to twice a day.  See how are you doing  If it is painful, increase to 3 times a day 90 capsule 0    naproxen (NAPROSYN) 375 MG tablet Take 1 tablet (375 mg) by  mouth 2 times daily (with meals). 40 tablet 0    oxymetazoline (AFRIN) 0.05 % nasal spray Spray 2 sprays into each nostril 2 times daily. Recommend limiting use to three days only 1 bottle 0    rizatriptan (MAXALT) 10 MG tablet Take 1 tablet (10 mg) by mouth once as needed for Migraine for up to 1 dose. May repeat in 2 hours in needed 10 tablet 1     No current facility-administered medications for this visit.        Encounter created by Care Assist MA.  If further action required please route encounter to appropriate in clinic MA/LVN/Resident pool.

## 2020-03-25 MED ORDER — NAPROXEN 375 MG OR TABS
375.0000 mg | ORAL_TABLET | Freq: Two times a day (BID) | ORAL | 0 refills | Status: DC
Start: 2020-03-25 — End: 2020-06-17

## 2020-03-25 MED ORDER — GABAPENTIN 300 MG OR CAPS
300.0000 mg | ORAL_CAPSULE | Freq: Three times a day (TID) | ORAL | 0 refills | Status: DC
Start: 2020-03-25 — End: 2020-06-13

## 2020-03-25 NOTE — Telephone Encounter (Signed)
Covering for primary care provider. Temporary Prescription refilled gabapentin and naproxen. Recommend baseline BMP if patient continuing with this tx plan. Will forward to PCP Dr. Donzetta Matters for review.      Ellin Mayhew NP

## 2020-05-07 ENCOUNTER — Encounter (INDEPENDENT_AMBULATORY_CARE_PROVIDER_SITE_OTHER): Payer: Self-pay | Admitting: Sports Medicine

## 2020-05-10 NOTE — Telephone Encounter (Signed)
From: [name]  To: Laurence Compton, MD  Sent: 05/07/2020 6:35 PM PDT  Subject: 1-Non Urgent Medical Advice    Hi I need to make an appointment for Monday or Tuesday to rule out if I have uti I have some symptoms but not all for uti. Thank you

## 2020-06-13 ENCOUNTER — Other Ambulatory Visit (INDEPENDENT_AMBULATORY_CARE_PROVIDER_SITE_OTHER): Payer: Self-pay | Admitting: Sports Medicine

## 2020-06-13 DIAGNOSIS — M5440 Lumbago with sciatica, unspecified side: Secondary | ICD-10-CM

## 2020-06-13 NOTE — Telephone Encounter (Signed)
Incoming call from patient requesting refill    Established with: Laurence Compton   Last OV with PCP: 02/29/2020   Next OV with PCP: 06/17/2020   Last OV in department: 02/29/2020   Next OV in department: 06/17/2020    Requested Medication(s):  Requested Prescriptions     Pending Prescriptions Disp Refills    gabapentin (NEURONTIN) 300 MG capsule 90 capsule 0     Sig: Take 1 capsule (300 mg) by mouth 3 times daily. Start 300 mg at niht time for 5 days, then increase to twice a day.  See how are you doing  If it is painful, increase to 3 times a day    naproxen (NAPROSYN) 375 MG tablet 40 tablet 0     Sig: Take 1 tablet (375 mg) by mouth 2 times daily (with meals).     Allergies   Allergen Reactions    Shellfish-Derived Products Swelling        Send to:     [facility] [address] [place], [place] - [address]  [address]  [place] [address]  Phone: [phone] Fax: [phone]    Last labs:   No results found for: CHOL, HDL, LDLCALC, TRIG, LDLDIRECT, TSH, A1C   Blood Pressure   02/29/20 124/72   02/18/20 121/78   01/26/20 117/76      Health Maintenance Due   Topic Date Due    Pneumococcal Vaccine (1 of 4 - PCV13) Never done    Hepatitis C Screening  Never done    COVID-19 Vaccine (2 - Pfizer 2-dose series) 02/11/2020    Influenza (1) 04/03/2020        Current Medication(s):   Current Outpatient Medications   Medication Sig Dispense Refill    fluticasone propionate (FLONASE) 50 MCG/ACT nasal spray Spray 1 spray into each nostril daily. 1 bottle 3    gabapentin (NEURONTIN) 300 MG capsule Take 1 capsule (300 mg) by mouth 3 times daily. Start 300 mg at niht time for 5 days, then increase to twice a day.  See how are you doing  If it is painful, increase to 3 times a day 90 capsule 0    naproxen (NAPROSYN) 375 MG tablet Take 1 tablet (375 mg) by mouth 2 times daily (with meals). 40 tablet 0    oxymetazoline (AFRIN) 0.05 % nasal spray Spray 2 sprays into each nostril 2 times daily.  Recommend limiting use to three days only 1 bottle 0    rizatriptan (MAXALT) 10 MG tablet Take 1 tablet (10 mg) by mouth once as needed for Migraine for up to 1 dose. May repeat in 2 hours in needed 10 tablet 1     No current facility-administered medications for this visit.        Encounter created by Care Assist MA.  If further action required please route encounter to appropriate in clinic MA/LVN/Resident pool.

## 2020-06-13 NOTE — Telephone Encounter (Signed)
Patient is requesting refill via MyChart  Medication requested:   Requested Prescriptions     Pending Prescriptions Disp Refills    gabapentin (NEURONTIN) 300 MG capsule 90 capsule 0     Sig: Take 1 capsule (300 mg) by mouth 3 times daily. Start 300 mg at niht time for 5 days, then increase to twice a day.  See how are you doing  If it is painful, increase to 3 times a day    naproxen (NAPROSYN) 375 MG tablet 40 tablet 0     Sig: Take 1 tablet (375 mg) by mouth 2 times daily (with meals).     Last refill: 03/25/20  Pharmacy:   [facility] - [place], [place] - [address]  [address]  [place] [address]  Phone: [phone] Fax: [phone]  Last visit in this department 02/29/2020  Next visit in this department 06/17/2020  Last date of urine immunoassay drug screen       Last date of CURES report                      Prescription refill pended and routed to PCP for review/approval if appropriate.

## 2020-06-13 NOTE — Telephone Encounter (Signed)
[facility]     Naproxen, gabapentin is not currently included in the Pharmacy Refill Clinic protocols. Re-routing to the responsible staff for processing.  Thank you

## 2020-06-14 MED ORDER — GABAPENTIN 300 MG OR CAPS
300.0000 mg | ORAL_CAPSULE | Freq: Three times a day (TID) | ORAL | 0 refills | Status: DC | PRN
Start: 2020-06-14 — End: 2020-06-17

## 2020-06-14 NOTE — Telephone Encounter (Signed)
Limited refill of gabapentin given. Patient needs follow up in clinic for recheck prior to further refills.

## 2020-06-17 ENCOUNTER — Other Ambulatory Visit (INDEPENDENT_AMBULATORY_CARE_PROVIDER_SITE_OTHER): Payer: Self-pay | Admitting: Sports Medicine

## 2020-06-17 ENCOUNTER — Telehealth (INDEPENDENT_AMBULATORY_CARE_PROVIDER_SITE_OTHER): Payer: Self-pay | Admitting: Sports Medicine

## 2020-06-17 ENCOUNTER — Telehealth (INDEPENDENT_AMBULATORY_CARE_PROVIDER_SITE_OTHER): Payer: TRICARE Prime—HMO | Admitting: Sports Medicine

## 2020-06-17 ENCOUNTER — Encounter (INDEPENDENT_AMBULATORY_CARE_PROVIDER_SITE_OTHER): Payer: Self-pay | Admitting: Sports Medicine

## 2020-06-17 ENCOUNTER — Other Ambulatory Visit: Payer: Self-pay

## 2020-06-17 DIAGNOSIS — M5440 Lumbago with sciatica, unspecified side: Secondary | ICD-10-CM

## 2020-06-17 DIAGNOSIS — M5441 Lumbago with sciatica, right side: Secondary | ICD-10-CM

## 2020-06-17 DIAGNOSIS — G43109 Migraine with aura, not intractable, without status migrainosus: Secondary | ICD-10-CM

## 2020-06-17 DIAGNOSIS — G8929 Other chronic pain: Secondary | ICD-10-CM

## 2020-06-17 DIAGNOSIS — Z789 Other specified health status: Secondary | ICD-10-CM | POA: Insufficient documentation

## 2020-06-17 MED ORDER — GABAPENTIN 300 MG OR CAPS
300.0000 mg | ORAL_CAPSULE | Freq: Three times a day (TID) | ORAL | 3 refills | Status: DC | PRN
Start: 2020-06-17 — End: 2020-10-27

## 2020-06-17 MED ORDER — RIZATRIPTAN BENZOATE 10 MG OR TABS
10.0000 mg | ORAL_TABLET | Freq: Once | ORAL | 1 refills | Status: DC | PRN
Start: 2020-06-17 — End: 2020-10-27

## 2020-06-17 MED ORDER — NAPROXEN 375 MG OR TABS
375.0000 mg | ORAL_TABLET | Freq: Two times a day (BID) | ORAL | 0 refills | Status: DC
Start: 2020-06-17 — End: 2020-10-27

## 2020-06-17 NOTE — Interdisciplinary (Signed)
Pre-visit chart review and huddle completed with staff and physician.    Outstanding labs, imaging and consults reviewed and identified.    Health maintanence issues identified and addressed:    Health Maintenance   Topic Date Due    Pneumococcal Vaccine (1 of 2 - PPSV23) Never done    Hepatitis C Screening  Never done    Influenza (1) 04/03/2020    Cervical Cancer Screening  09/03/2020    PHQ2 depression screen  02/17/2021    Tetanus (3 - Td or Tdap) 03/03/2028    COVID-19 Vaccine  Completed    Polio Vaccine  Aged Out    HPV Vaccine <= 66 Yrs  Aged Out    Meningococcal MCV4 Vaccine  Aged Out

## 2020-06-17 NOTE — Telephone Encounter (Signed)
Spoke with pt, pt will begin e checking in now, pt inquired about her Rx Gabapentin and Naproxen. Pt thought the medication was sent to [facility].

## 2020-06-17 NOTE — Patient Instructions (Signed)
For the MRI:  Please call [facility] Scheduling at [phone] to schedule your appointment at your earliest convenience. In order to obtain the most accurate insurance approval, we secure authorization after you are registered and scheduled for imaging services. Please have your insurance cards available when scheduling your exam.

## 2020-06-17 NOTE — Progress Notes (Signed)
FAMILY MEDICINE TELEMEDICINE PROGRESS NOTE    ----------------------(data below generated by Loriene Taunton J Joriel Streety, MD)--------------------     Patient Verification & Telemedicine Consent & Financial Waiver:    1.   Identity: I have verified this patient's identity to be accurate.  2.   Consent: I verify consent has been secured in one of the following methods: (a) obtained written/ online attestation consent (via MyChartVideoVisit pathway), (b) the spoke-side provider has obtained verbal or written consent from patient/surrogate (if this is a "provider to provider" evaluation), or (c) in all other cases, I have personally obtained verbal consent from the patient/ surrogate (noting all elements below) to perform this voluntary telemedicine evaluation (including obtaining history, performing examination and reviewing data provided by the patient).   The patient/ surrogate has the right to refuse this evaluation.  I have explained risks (including potential loss of confidentiality), benefits, alternatives, and the potential need for subsequent face to face care. Patient/ surrogate understands that there is a risk of medical inaccuracies given that our recommendations will be made based on reported data (and we must therefore assume this information is accurate).  Knowing that there is a risk that this information is not reported accurately, and that the telemedicine video, audio, or data feed may be incomplete, the patient agrees to proceed with evaluation and holds us harmless knowing these risks.  3.   Healthcare Team: The patient/ surrogate has been notified that other healthcare professionals (including students, residents and technical personnel) may be involved in this audio-video evaluation.   All laws concerning confidentiality and patient access to medical records and copies of medical records apply to telemedicine.  4.   Privacy: If this is a MyChart Video Visit, the patient/ surrogate has received the [place]  Notice of Privacy Practices via E-Checkin process.  For all other video visit techniques, I have verbally provided the patient/ surrogate with the [place] web link in English (https://[email or web address]/Pages/hipaa.aspx) or Spanish (https://health.[email or web address]/hipaa/Pages/hipaa_sp.aspx).  The patient/ surrogate acknowledges both being provided the NPP link, and has been offered to have the NPP mailed to the patient/ surrogate by [place] mail.  The patient/ surrogate has voiced understanding an acknowledgement of receipt of this NPP web address.  If the patient/surrogate has elected to receive the NPP via US mail, I verify that the NPP will be sent promptly to the patient/surrogate via [place] mail.  5.   Capacity: I have reviewed this above verification and consent paragraph with the patient/ surrogate and the patient is capacitated or has a surrogate. If the patient is not capacitated to understand the above, and no surrogate is available, since this is not an emergency evaluation, the visit will be rescheduled until such time that the patient can consent, or the surrogate is available to consent. If this is an emergency evaluation and the patient is not capacitated to understand the above, and no surrogate is available, I am proceeding with this evaluation as this is felt to be an emergency setting and no appropriate specialist is available at the bedside to perform these evaluations.  6.   Financial Waiver: If this is a MyChart Video Visit, the patient has been made aware of the financial waiver via E-Checkin process.  For all other video visit techniques, an E-Checkin process is not performed.  As such, I have personally verbally informed the patient/ surrogate that this evaluation will be a billable encounter similar to an in-person clinic visit, and the patient/ surrogate has agreed to pay the fee for services rendered.    If we are billing insurance for the patient's telehealth visit, her out-of-pocket cost will be  determined based on her plan and will be billed to her.  The patient/ surrogate has also been informed that if the patient does not have insurance or does not wish to use insurance, [place] [address] [employer] price for a primary care telehealth visit is $59.00 and specialist telehealth visit is $88.00.  I have further informed the patient/ surrogate that in the event the patient has additional services provided in conjunction with the specialty visit (Ex. Psychotherapy services), those services will be billed at the current rate less a 45% discount.  7.   Intra-State Location: The patient/ surrogate attests to understanding that if the patient accesses these services from a location outside of [place], that the patient does so at the patient's own risk and initiative and that the patient is ultimately responsible for compliance with any laws or regulations associated with the patient's use.  8.   Specific Use:The patient/ surrogate understands that [place] makes no representation that materials or servicesdelivered via telecommunication services, or listed on telemedicine websites, are appropriate or available for use in any other location.         Demographics:  Medical Record #: [id number]  Date: June 17, 2020  Patient Name: Angelica Wade  DOB: [birth year]  Age: 34 year old  Sex: female  Location: Home address on file     Evaluator(s):  Davanee Klinkner was evaluated by me today.    Clinic Location: [facility]  [place] [facility]  [address]  [place] [address]      Patient is unable to be seen face-to-face in clinic at this time because: Due to COVID-19 pandemic and a federally declared state of public health emergency, this service is being conducted via video in order to reduce exposure for staff and patients.      Chief Complaint   Patient presents with    Musculoskeletal Problem     follow up      History of Present Illness      Angelica Wade is a 34 year old  female who is being evaluated today for:    #Chronic back pain:  Had another acute flare a week ago  Angelica Wade Flesher to take a bath, after water drained she tried to get up and couldn't move her right leg  Felt more in her hip than her prior sciatica  Felt like bone on bone in hip  Very painful  In bed all day Saturday  Heating pad, stretching, decreased activity during the week  Has improved somewhat  This week doing okay  Medications weren't helping  Reports this has been the 4th or 5th episode of similar symptoms this month  Any significant activity or lifting will flare her pain   Worse when laying flat  Does get back spasms  Gabapentin at night 600 mg which is very helpful  Naproxen during the day during acute flares    #COVID history  Picked up from TEFL teacher  Tested positive August 7th  Mild symptoms for only about 5 days, have resolved and feels fine since then  Felt like "sinus infection" but otherwise felt fine    Review of Systems     As per HPI    Past History     Current Outpatient Medications   Medication Sig Dispense Refill    fluticasone propionate (FLONASE) 50 MCG/ACT nasal spray Spray  1 spray into each nostril daily. 1 bottle 3    gabapentin (NEURONTIN) 300 MG capsule Take 1 capsule (300 mg) by mouth 3 times daily as needed (pain). 90 capsule 3    naproxen (NAPROSYN) 375 MG tablet Take 1 tablet (375 mg) by mouth 2 times daily (with meals). 60 tablet 0    oxymetazoline (AFRIN) 0.05 % nasal spray Spray 2 sprays into each nostril 2 times daily. Recommend limiting use to three days only 1 bottle 0    rizatriptan (MAXALT) 10 MG tablet Take 1 tablet (10 mg) by mouth once as needed for Migraine for up to 1 dose. May repeat in 2 hours in needed 10 tablet 1     No current facility-administered medications for this visit.     Allergies   Allergen Reactions    Shellfish-Derived Products Swelling       Patient Active Problem List   Diagnosis    Unspecified mood (affective) disorder (CMS-HCC)    Chronic low back  pain    Allergic rhinitis    Insomnia    Anxiety    Nexplanon in place    Adequate exercise, at or above goal     Past Medical History:   Diagnosis Date    Allergic rhinitis     Bipolar affective disorder (CMS-HCC)     Chronic low back pain      Past Surgical History:   Procedure Laterality Date    NO PAST SURGERIES       Social History     Tobacco Use    Smoking status: Current Some Day Smoker     Types: Cigarettes, Electronic Cigarette (Smokeless)    Smokeless tobacco: Current User    Tobacco comment: former cigarettes, vapes currently   Substance Use Topics    Alcohol use: Yes    Drug use: Never       Physical Exam     Physical Exam: (limited to visualization over video)  GENERAL: NAD, comfortable, WDWN  RESP:  Unlabored breathing  PSYCH: normal affect  NEURO:  alert & oriented  SKIN: Intact, no rashes or erythema, no open wounds      Assessment/Plan     Alora was seen today for musculoskeletal problem.    Diagnoses and all orders for this visit:    Chronic bilateral low back pain with right-sided sciatica  Continues to have recurrent flares, most recently a week ago with slow improvement throughout this week and feeling better today. Previously referred for MRI due to recurrent radicular symptoms not improved with conservative treatment, will call and schedule. Will also place referral to pain clinic as patient reports she has previously had back injections done when living in [facility]. Refill of gabapentin and naproxen to have for acute flares. Return precautions for any worsening symptoms, difficulty walking, saddle anesthesia, bowel/bladder issues.   -     Pain Clinic  -     naproxen (NAPROSYN) 375 MG tablet; Take 1 tablet (375 mg) by mouth 2 times daily (with meals).  -     gabapentin (NEURONTIN) 300 MG capsule; Take 1 capsule (300 mg) by mouth 3 times daily as needed (pain).    Migraine with aura and without status migrainosus, not intractable  Requesting refill of triptan  -      rizatriptan (MAXALT) 10 MG tablet; Take 1 tablet (10 mg) by mouth once as needed for Migraine for up to 1 dose. May repeat in 2 hours in needed  Adequate exercise, at or above goal      Follow up  Return if symptoms worsen or fail to improve, for after MRI and pain consult.    Medication Review:  Medications reviewed with patient and medication list reconciled.  Over the counter medications, herbal therapies and supplements reviewed.  Patient's understanding and response to medications assessed.   Barriers to medications assessed and addressed.   Risks, benefits, alternatives to medications reviewed.    No barriers to learning, verbalizes understanding of teaching and instructions.      Junita Push, MD  [facility]  Pager [phone]

## 2020-10-25 ENCOUNTER — Encounter (INDEPENDENT_AMBULATORY_CARE_PROVIDER_SITE_OTHER): Payer: Self-pay | Admitting: Sports Medicine

## 2020-10-25 NOTE — Telephone Encounter (Signed)
From: [name]  To: Laurence Compton, MD  Sent: 10/25/2020 7:18 AM PST  Subject: Medication refills and appointment     I need to get a zoom appointment with mr Donzetta Matters there was some insurance misunderstanding and I think its cleared up . So I need a zoom call to get new referrals for a mri and get my medication refill for gabapentin and naproxen

## 2020-10-26 ENCOUNTER — Telehealth (INDEPENDENT_AMBULATORY_CARE_PROVIDER_SITE_OTHER): Payer: Self-pay | Admitting: Sports Medicine

## 2020-10-26 DIAGNOSIS — G8929 Other chronic pain: Secondary | ICD-10-CM

## 2020-10-26 NOTE — Telephone Encounter (Signed)
Who is calling: Incoming call from patient  Insurance Coverage Verified: Active- in network    Reason for this call: Pt needs PCP to re submit MRI order to her ins. She is also requesting refill for Gabapentin and naproxen    Action required by office: Please contact caller    Best way to contact: [phone]  Alternative:       Duplicate encounter? No previous documentation found on this issue.  Inquiry has been read verbatim to this caller. Verbalizes satisfaction and confirms the above is accurate: yes    Caller has been advised this message will be transmitted to office and can expect a response within the next 24-72 hours.

## 2020-10-26 NOTE — Telephone Encounter (Signed)
[place] [facility]     Gabapentin (Neurontin) + Naproxen (Naprosyn) are not currently included in the Pharmacy Refill Clinic protocols. Re-routing to the responsible staff for processing.  Thank you!

## 2020-10-26 NOTE — Telephone Encounter (Signed)
Patient scheduled on 10/27/2020 for a medication review.

## 2020-10-27 ENCOUNTER — Other Ambulatory Visit (INDEPENDENT_AMBULATORY_CARE_PROVIDER_SITE_OTHER): Payer: Self-pay | Admitting: Sports Medicine

## 2020-10-27 ENCOUNTER — Telehealth (INDEPENDENT_AMBULATORY_CARE_PROVIDER_SITE_OTHER): Payer: TRICARE Prime—HMO | Admitting: Sports Medicine

## 2020-10-27 ENCOUNTER — Encounter (INDEPENDENT_AMBULATORY_CARE_PROVIDER_SITE_OTHER): Payer: Self-pay | Admitting: Sports Medicine

## 2020-10-27 DIAGNOSIS — G8929 Other chronic pain: Secondary | ICD-10-CM

## 2020-10-27 DIAGNOSIS — G43109 Migraine with aura, not intractable, without status migrainosus: Secondary | ICD-10-CM

## 2020-10-27 DIAGNOSIS — M5441 Lumbago with sciatica, right side: Secondary | ICD-10-CM

## 2020-10-27 MED ORDER — GABAPENTIN 300 MG OR CAPS
300.0000 mg | ORAL_CAPSULE | Freq: Every evening | ORAL | 3 refills | Status: AC
Start: 2020-10-27 — End: ?

## 2020-10-27 MED ORDER — RIZATRIPTAN BENZOATE 10 MG OR TABS
ORAL_TABLET | ORAL | 1 refills | Status: DC
Start: 2020-10-27 — End: 2022-01-31

## 2020-10-27 MED ORDER — NAPROXEN 375 MG OR TABS
375.0000 mg | ORAL_TABLET | Freq: Two times a day (BID) | ORAL | 1 refills | Status: DC
Start: 2020-10-27 — End: 2020-12-23

## 2020-10-27 NOTE — Progress Notes (Signed)
FAMILY MEDICINE TELEMEDICINE PROGRESS NOTE    Chief Complaint   Patient presents with    Medication Review     History of Present Illness      Angelica Wade is a 35 year old female who is being evaluated today for:    #Low back pain with sciatica:  Continues with low back pain and sciatica symptoms that flare up from time to time  Symptoms flared up again starting this past Saturday, no acute injury. Sunday couldn't walk at all due to pain, Monday was a little better and slowly improving from there  Pain same as before, radiating from middle of her back down the right leg  Symptoms flare up every few months or so, or anytime where she overexerts herself. Will last for several days to weeks and gradually improves  Takes gabapentin 300 mg nightly and naproxen during the day to help with symptoms  MRI previously ordered, but had trouble with her insurance and then order expired      Review of Systems     As per HPI, otherwise denies fevers, chills, saddle anesthesia, bowel/bladder issues    Past History     Current Outpatient Medications   Medication Sig Dispense Refill    gabapentin (NEURONTIN) 300 MG capsule Take 1 capsule (300 mg) by mouth nightly. 90 capsule 3    naproxen (NAPROSYN) 375 MG tablet Take 1 tablet (375 mg) by mouth 2 times daily (with meals). 60 tablet 1    rizatriptan (MAXALT) 10 MG tablet Take 1 tablet (10 mg) by mouth once as needed for Migraine for up to 1 dose. May repeat in 2 hours in needed 10 tablet 1     No current facility-administered medications for this visit.     Allergies   Allergen Reactions    Shellfish-Derived Products Swelling       Patient Active Problem List   Diagnosis    Unspecified mood (affective) disorder (CMS-HCC)    Chronic low back pain    Allergic rhinitis    Insomnia    Anxiety    Nexplanon in place    Adequate exercise, at or above goal     Past Medical History:   Diagnosis Date    Allergic rhinitis     Bipolar affective disorder (CMS-HCC)     Chronic low  back pain      Past Surgical History:   Procedure Laterality Date    NO PAST SURGERIES       Social History     Tobacco Use    Smoking status: Current Some Day Smoker     Types: Cigarettes, Electronic Cigarette (Smokeless)    Smokeless tobacco: Current User    Tobacco comment: former cigarettes, vapes currently   Substance Use Topics    Alcohol use: Yes    Drug use: Never       Physical Exam     Physical Exam: (limited to visualization over video)  GENERAL: NAD, comfortable, WDWN  RESP:  Unlabored breathing  PSYCH: normal affect  NEURO:  alert & oriented      Data Review (personally reviewed)     Imaging:   X-ray lumbosacral spine 02/17/20:  IMPRESSION:  Normal lumbar spine.      Assessment/Plan     Angelica Wade was seen today for medication review.    Diagnoses and all orders for this visit:    Chronic bilateral low back pain with right-sided sciatica  Continues to recurrent episodes right-sided low pain sciatica leg.  During these episodes pain is incapacitating has to lay in bed for several days until the pain improves.  She continues to take gabapentin at night and during the day her symptoms.  Prior x-rays reviewed and were normal.  Due to her continued recurrent symptoms evaluate with more advanced imaging with this time.  Recommend continue with conservative management treatments.  Follow up after MRI is resulted to discuss possible next steps in management.  -     MRI Lumbar Spine W/O Contrast; Future  -     naproxen (NAPROSYN) 375 MG tablet; Take 1 tablet (375 mg) by mouth 2 times daily (with meals).  -     gabapentin (NEURONTIN) 300 MG capsule; Take 1 capsule (300 mg) by mouth nightly.        Follow up  Return if symptoms worsen or fail to improve.    Medication Review:  Medications reviewed with patient and medication list reconciled.  Over the counter medications, herbal therapies and supplements reviewed.  Patient's understanding and response to medications assessed.   Barriers to medications assessed and  addressed.   Risks, benefits, alternatives to medications reviewed.    No barriers to learning, verbalizes understanding of teaching and instructions.    ---------------------(data below generated by Laurence Compton, MD)--------------------     Patient Verification & Telemedicine Consent & Financial Waiver:    1.   Identity: I have verified this patient's identity to be accurate.  2.   Consent: I verify consent has been secured in one of the following methods: (a) obtained written/ online attestation consent (via MyChartVideoVisit pathway), (b) the spoke-side provider has obtained verbal or written consent from patient/surrogate (if this is a "provider to provider" evaluation), or (c) in all other cases, I have personally obtained verbal consent from the patient/ surrogate (noting all elements below) to perform this voluntary telemedicine evaluation (including obtaining history, performing examination and reviewing data provided by the patient).   The patient/ surrogate has the right to refuse this evaluation.  I have explained risks (including potential loss of confidentiality), benefits, alternatives, and the potential need for subsequent face to face care. Patient/ surrogate understands that there is a risk of medical inaccuracies given that our recommendations will be made based on reported data (and we must therefore assume this information is accurate).  Knowing that there is a risk that this information is not reported accurately, and that the telemedicine video, audio, or data feed may be incomplete, the patient agrees to proceed with evaluation and holds [place] harmless knowing these risks.  3.   Healthcare Team: The patient/ surrogate has been notified that other healthcare professionals (including students, residents and Engineer, maintenance) may be involved in this audio-video evaluation.   All laws concerning confidentiality and patient access to medical records and copies of medical records apply to  telemedicine.  4.   Privacy: If this is a Restaurant manager, fast food Visit, the patient/ surrogate has received the [place] Notice of Privacy Practices via E-Checkin process.  For all other video visit techniques, I have verbally provided the patient/ surrogate with the [place] web link in [place] (https://health.[employer].si.aspx) or Spanish (https://health.[employer].ch.aspx).  The patient/ surrogate acknowledges both being provided the NPP link, and has been offered to have the NPP mailed to the patient/ surrogate by [place] mail.  The patient/ surrogate has voiced understanding an acknowledgement of receipt of this NPP web address.  If the patient/surrogate has elected to receive the NPP via [place] mail, I verify that the NPP will  be sent promptly to the patient/surrogate via [place] mail.  5.   Capacity: I have reviewed this above verification and consent paragraph with the patient/ surrogate and the patient is capacitated or has a surrogate. If the patient is not capacitated to understand the above, and no surrogate is available, since this is not an emergency evaluation, the visit will be rescheduled until such time that the patient can consent, or the surrogate is available to consent. If this is an emergency evaluation and the patient is not capacitated to understand the above, and no surrogate is available, I am proceeding with this evaluation as this is felt to be an emergency setting and no appropriate specialist is available at the bedside to perform these evaluations.  6.   Financial Waiver: If this is a Restaurant manager, fast food Visit, the patient has been made aware of the financial waiver via E-Checkin process.  For all other video visit techniques, an E-Checkin process is not performed.  As such, I have personally verbally informed the patient/ surrogate that this evaluation will be a billable encounter similar to an in-person clinic visit, and the patient/ surrogate has agreed to pay the fee for services  rendered.  If we are billing insurance for the patient's telehealth visit, her out-of-pocket cost will be determined based on her plan and will be billed to her.  The patient/ surrogate has also been informed that if the patient does not have insurance or does not wish to use insurance, [place] [address] [employer] price for a primary care telehealth visit is $59.00 and specialist telehealth visit is $88.00.  I have further informed the patient/ surrogate that in the event the patient has additional services provided in conjunction with the specialty visit (Ex. Psychotherapy services), those services will be billed at the current rate less a 45% discount.  7.   Intra-State Location: The patient/ surrogate attests to understanding that if the patient accesses these services from a location outside of [place], that the patient does so at the patient's own risk and initiative and that the patient is ultimately responsible for compliance with any laws or regulations associated with the patient's use.  8.   Specific Use:The patient/ surrogate understands that [place] makes no representation that materials or servicesdelivered via telecommunication services, or listed on telemedicine websites, are appropriate or available for use in any other location.     Demographics:  Medical Record #: [id number]  Date: October 27, 2020  Patient Name: Angelica Wade  DOB: Jun 29, 1986  Age: 35 year old  Sex: female  Location: Home address on file     Evaluator(s):  Angelica Wade was evaluated by me today.    Clinic Location: [facility]  [place] [facility]  [address]  [place] [address]       Junita Push, MD  [facility]  Pager [phone]

## 2020-10-27 NOTE — Interdisciplinary (Signed)
Pre-visit chart review and huddle completed with staff and physician.    Outstanding labs, imaging and consults reviewed and identified.    Health maintanence issues identified and addressed:    Health Maintenance   Topic Date Due    Pneumococcal Vaccine (1 of 2 - PPSV23) Never done    Hepatitis C Screening  Never done    Influenza (1) 04/03/2020    COVID-19 Vaccine (3 - Booster for Pfizer series) 06/22/2020    Cervical Cancer Screening  09/03/2020    PHQ2 depression screen  02/17/2021    Tetanus (3 - Td or Tdap) 03/03/2028    Polio Vaccine  Aged Out    HPV Vaccine <= 26 Yrs  Aged Out    Meningococcal MCV4 Vaccine  Aged Out

## 2020-10-27 NOTE — Telephone Encounter (Signed)
Incoming fax refill request from pharmacy  Medication requested:   Requested Prescriptions     Pending Prescriptions Disp Refills    rizatriptan (MAXALT) 10 MG tablet [Pharmacy Med Name: RIZATRIPTAN 10 MG TABLET] 10 tablet 1     Sig: TAKE 1 TAB ORALLY ONCE AS NEEDED FOR MIGRAINE FOR UP TO 1 DOSE. MAY REPEAT IN 2 HRS IF NEEDED       Last refill: 06/17/2020  Ph[facility]acy[facility]     [facility] [address] [place] - [address]  [address] [place] [address]  Phone: [phone] Fax: [phone]    Last visit in this department 06/17/2020  Next visit in this department 10/27/2020    Last date of urine immunoassay drug screen       Last date of CURES report                        Prescription refill pended and routed to PCP for review/approval if appropriate.

## 2020-10-27 NOTE — Telephone Encounter (Signed)
[place] [facility]     Maxalt is not currently included in the Pharmacy Refill Clinic protocols. Re-routing to the responsible staff for processing.  Thank you

## 2020-11-17 ENCOUNTER — Encounter (INDEPENDENT_AMBULATORY_CARE_PROVIDER_SITE_OTHER): Payer: Self-pay | Admitting: Sports Medicine

## 2020-11-17 ENCOUNTER — Ambulatory Visit
Admission: RE | Admit: 2020-11-17 | Discharge: 2020-11-17 | Disposition: A | Discharge status: Home / Self Care | Payer: TRICARE Prime—HMO | Attending: Sports Medicine | Admitting: Sports Medicine

## 2020-11-17 ENCOUNTER — Other Ambulatory Visit: Payer: Self-pay

## 2020-11-17 DIAGNOSIS — M5441 Lumbago with sciatica, right side: Secondary | ICD-10-CM

## 2020-11-17 DIAGNOSIS — G8929 Other chronic pain: Secondary | ICD-10-CM

## 2020-11-18 NOTE — Telephone Encounter (Signed)
From: [name]  To: Laurence Compton, MD  Sent: 11/17/2020 5:00 PM PDT  Subject: Reschedule mri    I went and tried to do my mri and I couldnt finish it due to be claustrophobic . They said I can call and have it re summited and that my doctor can call me and talk to me .

## 2020-11-18 NOTE — Telephone Encounter (Signed)
From: [name]  To: Laurence Compton, MD  Sent: 11/17/2020 6:12 PM PDT  Subject: Question regarding MRI LUMBAR SPINE W/O CONTRAST    So I have to redo this if so can I please get something that will help me with it .

## 2020-11-21 MED ORDER — LORAZEPAM 0.5 MG OR TABS
0.5000 mg | ORAL_TABLET | Freq: Once | ORAL | 0 refills | Status: DC | PRN
Start: 2020-11-21 — End: 2020-12-14

## 2020-12-09 ENCOUNTER — Encounter (INDEPENDENT_AMBULATORY_CARE_PROVIDER_SITE_OTHER): Payer: Self-pay | Admitting: Sports Medicine

## 2020-12-13 ENCOUNTER — Encounter (INDEPENDENT_AMBULATORY_CARE_PROVIDER_SITE_OTHER): Payer: Self-pay | Admitting: Sports Medicine

## 2020-12-14 ENCOUNTER — Emergency Department (HOSPITAL_BASED_OUTPATIENT_CLINIC_OR_DEPARTMENT_OTHER): Admit: 2020-12-14 | Payer: TRICARE Prime—HMO

## 2020-12-14 ENCOUNTER — Other Ambulatory Visit (INDEPENDENT_AMBULATORY_CARE_PROVIDER_SITE_OTHER): Payer: Self-pay | Admitting: Sports Medicine

## 2020-12-14 ENCOUNTER — Encounter (INDEPENDENT_AMBULATORY_CARE_PROVIDER_SITE_OTHER): Payer: Self-pay | Admitting: Sports Medicine

## 2020-12-14 ENCOUNTER — Ambulatory Visit (INDEPENDENT_AMBULATORY_CARE_PROVIDER_SITE_OTHER): Payer: Self-pay | Admitting: Sports Medicine

## 2020-12-14 DIAGNOSIS — G8929 Other chronic pain: Secondary | ICD-10-CM

## 2020-12-14 MED ORDER — GABAPENTIN 300 MG OR CAPS
300.0000 mg | ORAL_CAPSULE | Freq: Three times a day (TID) | ORAL | 3 refills | Status: DC | PRN
Start: 2020-12-14 — End: 2021-05-24

## 2020-12-14 MED ORDER — LORAZEPAM 0.5 MG OR TABS
0.5000 mg | ORAL_TABLET | Freq: Once | ORAL | 0 refills | Status: DC | PRN
Start: 2020-12-14 — End: 2022-06-04

## 2020-12-14 NOTE — Telephone Encounter (Addendum)
PROVIDER ACTION REQUESTED: Yes, please review  Action item needed:   Pt refusing to go to ED, would like to speak to PCP. Pt has scheduled MRI   Appt scheduled: No    Chief Complaint  Lower back pain/hip pain  x Since Monday 12/12/20   Assessment details:  Reports chronic issue, most recent episode started Monday. Pt reports unable to walk and having excruciating "100 out of 100"  pain in lower back. Sharp pain from lower back to bilateral legs, believes sciatic pain.Strict ED precautions reviewed. RN gave report to clinic RN. Routed to PCP/doctor of the day for further review    CLINIC/RN/LVN ACTION REQUEST: YES, please review and advise.   Action item needed: YES, please review and advise.     Routing to Provider for review and pt given strict ED precautions and reviewed all applicable Home Care Advice per protocol.     COVID VACCINATION STATUS: Yes, fully vaccinated  COVID TRIAGE PROTOCOL: NEGATIVE   Symptoms w/in the last 10 days: fever or chills, cough, sob/diff breathing, fatigue, muscle or body aches, headache, loss of taste or smell, sore throat, congestion or runny nose, nausea and vomiting, diarrhea.   Questions:   1. Looking back to the last 10 days, have you been informed by a public health agency or a healthcare agency or a healthcare system that you have been exposed to Coronavirus (COVID-19)  2. Looking back to the last 10 days, has a person in your household been diagnosed with Coronavirus (COVID-19) infection       Reason for Call: Symptoms and Low Back Pain     Disposition: Go to ED Now       Reason for Disposition   Unable to walk    Additional Information   Negative: Passed out (i.e., lost consciousness, collapsed and was not responding)   Negative: Shock suspected (e.g., cold/pale/clammy skin, too weak to stand, low BP, rapid pulse)   Negative: Sounds like a life-threatening emergency to the triager   Negative: Major injury to the back (e.g., MVA, fall > 10 feet or 3 meters, penetrating  injury, etc.)   Negative: Followed a tailbone injury   Negative: [1] Pain in the upper back over the ribs (rib cage) AND [2] radiates (travels, goes) into chest   Negative: [1] Pain in the upper back over the ribs (rib cage) AND [2] worsened by coughing (or clearly increases with breathing)   Negative: Back pain during pregnancy   Negative: Pain mainly in flank (i.e., in the side, over the lower ribs or just below the ribs)   Negative: [1] SEVERE back pain (e.g., excruciating) AND [2] sudden onset AND [3] age > 60 years   Negative: [1] Unable to urinate (or only a few drops) > 4 hours AND [2] bladder feels very full (e.g., palpable bladder or strong urge to urinate)   Negative: [1] Loss of bladder or bowel control (urine or bowel incontinence; wetting self, leaking stool) AND [2] new-onset   Negative: Numbness in groin or rectal area (i.e., loss of sensation)   Negative: [1] SEVERE abdominal pain AND [2] present > 1 hour   Negative: [1] Abdominal pain AND [2] age > 60 years   Negative: Weakness of a leg or foot (e.g., unable to bear weight, dragging foot)    Answer Assessment - Initial Assessment Questions  1. ONSET: "When did the pain begin?"       Chronic issue. Started Monday- sharp pains in lower back. Sitting worse.  Unable to walk   2. LOCATION: "Where does it hurt?" (upper, mid or lower back)      Lower back  3. SEVERITY: "How bad is the pain?"  (e.g., Scale 1-10; mild, moderate, or severe)    - MILD (1-3): doesn't interfere with normal activities     - MODERATE (4-7): interferes with normal activities or awakens from sleep     - SEVERE (8-10): excruciating pain, unable to do any normal activities   10/10- "pins and needles"- sharp pain everytime move lower half of bpody   4. PATTERN: "Is the pain constant?" (e.g., yes, no; constant, intermittent)       Constant   5. RADIATION: "Does the pain shoot into your legs or elsewhere?"      Bilateral legs   6. CAUSE:  "What do you think is causing the back  pain?"       Chronic issues   7. BACK OVERUSE:  "Any recent lifting of heavy objects, strenuous work or exercise?"      No   8. MEDICATIONS: "What have you taken so far for the pain?" (e.g., nothing, acetaminophen, NSAIDS)      Ran out of gabapentin 2 weeks ago. Naproxen   9. NEUROLOGIC SYMPTOMS: "Do you have any weakness, numbness, or problems with bowel/bladder control?"       A lot weakness, "feels like sciatic nerve pain shooting down to hip area down to my legs". Reports "pins and neddles are shooting down my legs"   10. OTHER SYMPTOMS: "Do you have any other symptoms?" (e.g., fever, abdominal pain, burning with urination, blood in urine)         No  11. PREGNANCY: "Is there any chance you are pregnant?" (e.g., yes, no; LMP)  No , on period right now    Protocols used: BACK PAIN-A-AH

## 2020-12-14 NOTE — Telephone Encounter (Signed)
From: [name]  To: Laurence Compton, MD  Sent: 12/14/2020 8:46 AM PDT  Subject: Urgent     I started having trouble walking yesterday and now today I can't walk at all it hurts so so much if there is anything I can get for pain for a couple days . It's so hard calling

## 2020-12-14 NOTE — Telephone Encounter (Signed)
[facility] prior to [facility]

## 2020-12-14 NOTE — Telephone Encounter (Signed)
From: [name]  To: Laurence Compton, MD  Sent: 12/13/2020 6:25 PM PDT  Subject: Medication refill    Hi yes I scheduled my mri for the 20th of this month . When do I pick up the prescription for this . And I also need a new prescription for the gabapentin too I'm out of that .

## 2020-12-14 NOTE — Telephone Encounter (Signed)
General Inquiry     Who is calling: Incoming call from patient    Reason for this call: Pt requesting to correct sig for gabapentin and change it back to three times daily. Pt is out of medication and states naproxen does not help with her pain. Pended order.    Action required by office: please sign if appropriate     Duplicate encounter? No previous documentation found on this issue.     Best way to contact: [phone]  Alternative: [phone]    Inquiry has been read verbatim to this caller. Verbalizes satisfaction and confirms the above is accurate: yes    Has been advised this message will be transmitted to office and can expect a response within the next 24-72 hours.    Encounter created by Care Assist MA.  If further action required please route encounter to appropriate in clinic MA/LVN/Resident Pool

## 2020-12-14 NOTE — Telephone Encounter (Signed)
Triage RN attempted to contact this patient by phone with the following results: called patient in regard to my chart message, no answer, left voicemail to return call to clinic, phone number included. MyChart message also sent to patient.     CALL CENTER:  Please transfer to red flag line as message includes red flag symptoms

## 2020-12-14 NOTE — Telephone Encounter (Signed)
RN spoke w pt  Informed ER, pt states will try the gabapentin first   RN still enforced ER and instructed to call back if medication does not work

## 2020-12-21 ENCOUNTER — Ambulatory Visit
Admit: 2020-12-21 | Discharge: 2020-12-21 | Disposition: A | Discharge status: Home / Self Care | Payer: TRICARE Prime—HMO | Attending: Sports Medicine | Admitting: Sports Medicine

## 2020-12-21 ENCOUNTER — Encounter (INDEPENDENT_AMBULATORY_CARE_PROVIDER_SITE_OTHER): Payer: Self-pay | Admitting: Sports Medicine

## 2020-12-21 DIAGNOSIS — G8929 Other chronic pain: Secondary | ICD-10-CM

## 2020-12-23 ENCOUNTER — Other Ambulatory Visit (INDEPENDENT_AMBULATORY_CARE_PROVIDER_SITE_OTHER): Payer: Self-pay | Admitting: Sports Medicine

## 2020-12-23 DIAGNOSIS — G8929 Other chronic pain: Secondary | ICD-10-CM

## 2020-12-23 MED ORDER — NAPROXEN 375 MG OR TABS
375.00 mg | ORAL_TABLET | Freq: Two times a day (BID) | ORAL | 1 refills | Status: DC
Start: 2020-12-23 — End: 2021-02-22

## 2020-12-23 NOTE — Telephone Encounter (Signed)
[place] [facility]     naproxen is not currently included in the Pharmacy Refill Clinic protocols. Re-routing to the responsible staff for processing.  Thank you

## 2020-12-23 NOTE — Telephone Encounter (Signed)
Incoming fax refill request from pharmacy  Medication requested:   Requested Prescriptions     Pending Prescriptions Disp Refills   . naproxen (NAPROSYN) 375 MG tablet [Pharmacy Med Name: NAPROXEN 375 MG TABLET] 60 tablet 1     Sig: TAKE 1 TABLET (375 MG) BY MOUTH 2 TIMES DAILY (WITH MEALS).       Last refill:10/27/2020   [facility]   [facility] [address] [place] - [address] [facility] [address]  [address] [place] [address]  Phone: [phone] Fax: [phone]    Last visit in this department 10/27/2020  Next visit in this department Visit date not found    Last date of urine immunoassay drug screen       Last date of CURES report                        Prescription refill pended and routed to PCP for review/approval if appropriate.

## 2020-12-26 ENCOUNTER — Encounter (HOSPITAL_BASED_OUTPATIENT_CLINIC_OR_DEPARTMENT_OTHER): Payer: Self-pay

## 2021-02-09 ENCOUNTER — Encounter (INDEPENDENT_AMBULATORY_CARE_PROVIDER_SITE_OTHER): Payer: Self-pay | Admitting: Hospital

## 2021-02-21 ENCOUNTER — Other Ambulatory Visit (INDEPENDENT_AMBULATORY_CARE_PROVIDER_SITE_OTHER): Payer: Self-pay | Admitting: Sports Medicine

## 2021-02-21 DIAGNOSIS — G8929 Other chronic pain: Secondary | ICD-10-CM

## 2021-02-21 NOTE — Telephone Encounter (Signed)
[place] [facility]     Naproxen is not currently included in the Pharmacy Refill Clinic protocols. Re-routing to the responsible staff for processing.  Thank you

## 2021-02-22 MED ORDER — NAPROXEN 375 MG OR TABS
375.00 mg | ORAL_TABLET | Freq: Two times a day (BID) | ORAL | 1 refills | Status: DC
Start: 2021-02-22 — End: 2021-04-24

## 2021-02-22 NOTE — Telephone Encounter (Signed)
[facility]  Medication requested:   Requested Prescriptions     Pending Prescriptions Disp Refills   . naproxen (NAPROSYN) 375 MG tablet [Pharmacy Med Name: NAPROXEN 375 MG TABLET] 60 tablet 1     Sig: TAKE 1 TABLET (375 MG) BY MOUTH 2 TIMES DAILY (WITH MEALS).       Last refill: 12/23/20  [facility]   [facility] [address], [place] - [address] [facility] AT [address]  [address] [place] [address]  Phone: [phone] Fax: [phone]    Last visit in this department 10/27/2020 w dr Donzetta Matters   Next visit in this department Visit date not found    Last date of urine immunoassay drug screen       Last date of CURES report                        Prescription refill pended and routed to PCP for review/approval if appropriate.

## 2021-04-21 ENCOUNTER — Other Ambulatory Visit (INDEPENDENT_AMBULATORY_CARE_PROVIDER_SITE_OTHER): Payer: Self-pay | Admitting: Sports Medicine

## 2021-04-21 DIAGNOSIS — G8929 Other chronic pain: Secondary | ICD-10-CM

## 2021-04-22 NOTE — Telephone Encounter (Signed)
[place] [facility]     naproxen is not currently included in the Pharmacy Refill Clinic protocols. Re-routing to the responsible staff for processing.  Thank you

## 2021-04-24 MED ORDER — NAPROXEN 375 MG OR TABS
375.00 mg | ORAL_TABLET | Freq: Two times a day (BID) | ORAL | 0 refills | Status: DC
Start: 2021-04-24 — End: 2021-05-22

## 2021-04-24 NOTE — Telephone Encounter (Signed)
Incoming call [facility]  Medication requested:   Requested Prescriptions     Pending Prescriptions Disp Refills   . naproxen (NAPROSYN) 375 MG tablet [Pharmacy Med Name: NAPROXEN 375 MG TABLET] 60 tablet 1     Sig: TAKE 1 TABLET (375 MG) BY MOUTH 2 TIMES DAILY (WITH MEALS).       Last refill: 02/2021  [facility]   [facility] [address] [place] - [address] [facility] AT [address]  [address] [place] [address]  Phone: [phone] Fax: [phone]    Last visit in this department 10/27/2020 w dr Donzetta Matters   Next visit in this department Visit date not found    Last date of urine immunoassay drug screen       Last date of CURES report                        Prescription refill pended and routed to PCP for review/approval if appropriate.

## 2021-04-24 NOTE — Telephone Encounter (Signed)
Covering Dr Galloway's inbox  rx signed. Defer future refill request to PCP

## 2021-05-21 ENCOUNTER — Other Ambulatory Visit (INDEPENDENT_AMBULATORY_CARE_PROVIDER_SITE_OTHER): Payer: Self-pay | Admitting: Sports Medicine

## 2021-05-21 DIAGNOSIS — G8929 Other chronic pain: Secondary | ICD-10-CM

## 2021-05-22 ENCOUNTER — Other Ambulatory Visit (INDEPENDENT_AMBULATORY_CARE_PROVIDER_SITE_OTHER): Payer: Self-pay | Admitting: Sports Medicine

## 2021-05-22 DIAGNOSIS — G8929 Other chronic pain: Secondary | ICD-10-CM

## 2021-05-22 MED ORDER — NAPROXEN 375 MG OR TABS
375.00 mg | ORAL_TABLET | Freq: Two times a day (BID) | ORAL | 0 refills | Status: AC
Start: 2021-05-22 — End: ?

## 2021-05-22 NOTE — Telephone Encounter (Signed)
[place] [facility]     naproxen is not currently included in the Pharmacy Refill Clinic protocols. Re-routing to the responsible staff for processing.  Thank you

## 2021-05-22 NOTE — Telephone Encounter (Signed)
Incoming call [facility]  Medication requested:   Requested Prescriptions     Pending Prescriptions Disp Refills   . naproxen (NAPROSYN) 375 MG tablet [Pharmacy Med Name: NAPROXEN 375 MG TABLET] 60 tablet 0     Sig: TAKE 1 TABLET (375 MG) BY MOUTH 2 TIMES DAILY (WITH MEALS).       Last refill: 04/24/21  [facility]   [facility] [address] [place] - [address] [facility] [address]  [address] [place] [address]  Phone: [phone] Fax: [phone]    Last visit in this department 10/27/2020 w dr Donzetta Matters   Next visit in this department Visit date not found    Last date of urine immunoassay drug screen       Last date of CURES report                        Prescription refill pended and routed to PCP for review/approval if appropriate.

## 2021-05-22 NOTE — Telephone Encounter (Signed)
[place] [facility]     gabapentin  is not currently included in the Pharmacy Refill Clinic protocols. Re-routing to the responsible staff for processing.  Thank you.

## 2021-05-23 NOTE — Telephone Encounter (Signed)
Incoming fax refill request from pharmacy  Medication requested:   Requested Prescriptions     Pending Prescriptions Disp Refills   . gabapentin (NEURONTIN) 300 MG capsule [Pharmacy Med Name: GABAPENTIN 300 MG CAPSULE] 90 capsule 3     Sig: TAKE 1 CAPSULE (300 MG) BY MOUTH 3 TIMES DAILY AS NEEDED (PAIN).       Last refill: 10/27/2020  [facility]   [facility] [address] [place] - [address] [facility] [address]  [address] [place] [address]  Phone: [phone] Fax: [phone]    Last visit in this department 10/27/2020  Next visit in this department Visit date not found    Last date of urine immunoassay drug screen       Last date of CURES report                        Prescription refill pended and routed to PCP for review/approval if appropriate.

## 2021-05-24 MED ORDER — GABAPENTIN 300 MG OR CAPS
300.00 mg | ORAL_CAPSULE | Freq: Three times a day (TID) | ORAL | 3 refills | Status: AC | PRN
Start: 2021-05-24 — End: ?

## 2021-06-02 ENCOUNTER — Encounter: Payer: Self-pay | Admitting: Sports Medicine

## 2021-06-17 ENCOUNTER — Other Ambulatory Visit (INDEPENDENT_AMBULATORY_CARE_PROVIDER_SITE_OTHER): Payer: Self-pay | Admitting: Sports Medicine

## 2021-06-17 DIAGNOSIS — G8929 Other chronic pain: Secondary | ICD-10-CM

## 2021-06-19 NOTE — Telephone Encounter (Signed)
naproxen (NAPROSYN)  is not currently included in the Pharmacy Refill Clinic protocols. Re-routing to the responsible staff for processing.  Thank you

## 2021-06-19 NOTE — Telephone Encounter (Signed)
Established with:  Last OV with PCP:  Next OV with Dept:  Future Appointments: Laurence Compton   Visit date not found  Visit date not found  No future appointments.     Medication requested:   Requested Prescriptions     Pending Prescriptions Disp Refills    naproxen (NAPROSYN) 375 MG tablet [Pharmacy Med Name: NAPROXEN 375 MG TABLET] 60 tablet 0     Sig: TAKE 1 TABLET (375 MG) BY MOUTH 2 TIMES DAILY (WITH MEALS).       Last Filled Date 05/22/21   Quantity Last Filled     Refill 60   [facility]:    [facility] [address] [place] - [address] [facility] [address]  [address] [place] [address]  Phone: [phone] Fax: [phone]      Current Medication(s):  Current Outpatient Medications   Medication Sig Dispense Refill    gabapentin (NEURONTIN) 300 MG capsule TAKE 1 CAPSULE (300 MG) BY MOUTH 3 TIMES DAILY AS NEEDED (PAIN). 90 capsule 3    gabapentin (NEURONTIN) 300 MG capsule Take 1 capsule (300 mg) by mouth nightly. 90 capsule 3    LORazepam (ATIVAN) 0.5 MG tablet Take 1 tablet (0.5 mg) by mouth once as needed for Anxiety (prior to MRI) for up to 1 dose. 2 tablet 0    naproxen (NAPROSYN) 375 MG tablet TAKE 1 TABLET (375 MG) BY MOUTH 2 TIMES DAILY (WITH MEALS). 60 tablet 0    rizatriptan (MAXALT) 10 MG tablet TAKE 1 TAB ORALLY ONCE AS NEEDED FOR MIGRAINE FOR UP TO 1 DOSE. MAY REPEAT IN 2 HRS IF NEEDED 10 tablet 1     No current facility-administered medications for this visit.       Patient information: Allergies   Allergen Reactions    Shellfish-Derived Products Swelling      Health Maintenance Due   Topic Date Due    Pneumococcal Vaccine (1 - PCV) Never done    Hepatitis C Screening  Never done    COVID-19 Vaccine (3 - Booster for Pfizer series) 03/17/2020    Cervical Cancer Screening  09/03/2020    PHQ2 depression screen  02/17/2021    Influenza (1) 04/03/2021      Last labs:  No results found for: CHOL, HDL, LDLCALC, TRIG, LDLDIRECT, TSH, A1C   Blood Pressure   02/29/20 124/72    02/18/20 121/78   01/26/20 117/76    No components found for: 9M

## 2021-07-04 ENCOUNTER — Ambulatory Visit (INDEPENDENT_AMBULATORY_CARE_PROVIDER_SITE_OTHER): Payer: Self-pay | Admitting: Sports Medicine

## 2021-07-04 NOTE — Telephone Encounter (Addendum)
PROVIDER ACTION REQUESTED: No, FYI only   Action item needed:  None  Appt scheduled:   Yes  Future Appointments   Date Time Provider Department Center   07/06/2021  2:20 PM Franz Dell, MD [facility]       Chief Complaint  Sinus sx x 2 weeks  Assessment details:  Pt reported sinus sx nasal congestion with green discharge, sinus pressure eyebrow/cheeks, coughing, muffled hearing R>L,  intermittent headache.Have not tested with COVID, denies know exposure/travel. Denies fever, difficulty breathing. Have tried OTC Sudafed, Allegra, Nyquil but  Only getting some relief, Per pt she usually get sinus infection 3-4x/year.    Pt prefers for video visit.    CLINIC/RN/LVN ACTION REQUEST: NO, FYI Only  Action item needed: NO, FYI Only    Scheduled pt MCV[facility] appt, pt also sent MCVV instructions. , Advice given, Acute appt scheduled and pt given strict ED precautions and reviewed all applicable Home Care Advice per protocol.     If COVID+, is patient interested in Paxlovid or other COVID therapeutics: n/a  If COVID+, please advise pt to upload test result.     COVID VACCINATION STATUS:   Current Covid Vaccinations   Administered Date(s) Administered    COVID-19 Proofreader) Purple Cap >= 12 Years 12/31/2019, 01/21/2020       COVID TRIAGE PROTOCOL:   Symptoms w/in the last 10 days (no F2F appt): fever or chills, cough, sob/diff breathing, fatigue, muscle or body aches, headache, loss of taste or smell, sore throat, congestion or runny nose, nausea and vomiting, diarrhea.   Questions:   1. Looking back to the last 10 days, have you been informed by a public health agency or a healthcare agency or a healthcare system that you have been exposed to Coronavirus (COVID-19)  2. Looking back to the last 10 days, has a person in your household been diagnosed with Coronavirus (COVID-19) infection       Reason for Call: Symptoms and Sinus Problem     Disposition: See PCP When Office is Open (Within 3 Days)     Reason for  Disposition   [1] Sinus congestion (pressure, fullness) AND [2] present > 10 days   [1] Nasal discharge AND [2] present > 10 days    Additional Information   Negative: SEVERE difficulty breathing (e.g., struggling for each breath, speaks in single words)   Negative: Sounds like a life-threatening emergency to the triager   Negative: [1] Difficulty breathing AND [2] not from stuffy nose (e.g., not relieved by cleaning out the nose)   Negative: [1] SEVERE headache AND [2] fever   Negative: [1] Redness or swelling on the cheek, forehead or around the eye AND [2] fever   Negative: Fever > 104 F (40 C)   Negative: Patient sounds very sick or weak to the triager   Negative: [1] SEVERE pain AND [2] not improved 2 hours after pain medicine   Negative: [1] Redness or swelling on the cheek, forehead or around the eye AND [2] no fever   Negative: [1] Fever > 101 F (38.3 C) AND [2] age > 60 years   Negative: [1] Fever > 100.0 F (37.8 C) AND [2] bedridden (e.g., nursing home patient, CVA, chronic illness, recovering from surgery)   Negative: [1] Fever > 100.0 F (37.8 C) AND [2] diabetes mellitus or weak immune system (e.g., HIV positive, cancer chemo, splenectomy, organ transplant, chronic steroids)   Negative: Fever present > 3 days (72 hours)   Negative: [1]  Fever returns after gone for over 24 hours AND [2] symptoms worse or not improved   Negative: [1] Sinus pain (not just congestion) AND [2] fever   Negative: Earache    Answer Assessment - Initial Assessment Questions  1. LOCATION: "Where does it hurt?"       Pressure above eye brow and cheeks  2. ONSET: "When did the sinus pain start?"  (e.g., hours, days)       2 weeks  3. SEVERITY: "How bad is the pain?"   (Scale 1-10; mild, moderate or severe)    - MILD (1-3): doesn't interfere with normal activities     - MODERATE (4-7): interferes with normal activities (e.g., work or school) or awakens from sleep    - SEVERE (8-10): excruciating pain and patient  unable to do any normal activities         9/10 constant  4. RECURRENT SYMPTOM: "Have you ever had sinus problems before?" If Yes, ask: "When was the last time?" and "What happened that time?"     Sinus infection 3-4x a year  5. NASAL CONGESTION: "Is the nose blocked?" If Yes, ask: "Can you open it or must you breathe through the mouth?"    Nasal congestion, mouth breathing  6. NASAL DISCHARGE: "Do you have discharge from your nose?" If so ask, "What color?"      green  7. FEVER: "Do you have a fever?" If Yes, ask: "What is it, how was it measured, and when did it start?"       denies  8. OTHER SYMPTOMS: "Do you have any other symptoms?" (e.g., sore throat, cough, earache, difficulty breathing)    Muffled hearing, earache R>L, cough, sore throat, intermittent headache (has hx migraine)  9. PREGNANCY: "Is there any chance you are pregnant?" "When was your last menstrual period?"      denies    Protocols used: SINUS PAIN OR CONGESTION-A-AH

## 2021-07-04 NOTE — Telephone Encounter (Signed)
Symptom Call         What symptom is the patient experiencing? Patient has been experiencing coughing, sore throat, hearing lose, and sinus infection for the past 2 weeks. Please review and advise the patient.   Is this a new or ongoing symptom? new  When did the symptom(s) begin? 2 weeks   Who is reporting the symptoms? Incoming call from patient    Insurance Coverage Verified: yes  Insurance Plan Name:  [employer]  Assigned to [place]: yes  Next office visit:  Visit date not found  Did you offer Express Care/Urgent Care: (if met criteria for Same Day/3 Day scheduling but declines to schedule) yes    Transferred call to: Cold Symptoms Line (ext [phone])    Best way to contact patient: [phone]      P CARE NAV TRIAGE POOL [ [id number] ]        *After hours 5PM   a. For sites: [facility], [place], [place] [facility], [facility], [facility] , [facility] request On-Call Provider page via operator at [phone]  b. For Sites VTC, EBC, CGD, EAS, PHR and VIS request On-Call Provider page via operator at [phone]

## 2021-07-06 ENCOUNTER — Telehealth (INDEPENDENT_AMBULATORY_CARE_PROVIDER_SITE_OTHER): Payer: TRICARE Prime—HMO | Admitting: Family Practice

## 2021-07-06 ENCOUNTER — Encounter (INDEPENDENT_AMBULATORY_CARE_PROVIDER_SITE_OTHER): Payer: Self-pay | Admitting: Family Practice

## 2021-07-06 ENCOUNTER — Other Ambulatory Visit: Payer: Self-pay

## 2021-07-06 DIAGNOSIS — J309 Allergic rhinitis, unspecified: Secondary | ICD-10-CM

## 2021-07-06 DIAGNOSIS — J0101 Acute recurrent maxillary sinusitis: Secondary | ICD-10-CM

## 2021-07-06 MED ORDER — FLUTICASONE PROPIONATE 50 MCG/ACT NA SUSP
1.0000 | Freq: Every day | NASAL | 2 refills | Status: AC
Start: 2021-07-06 — End: ?

## 2021-07-06 MED ORDER — AMOXICILLIN 500 MG OR CAPS
500.0000 mg | ORAL_CAPSULE | Freq: Three times a day (TID) | ORAL | 0 refills | Status: DC
Start: 2021-07-06 — End: 2022-04-18

## 2021-07-06 NOTE — Interdisciplinary (Signed)
Pre-visit chart review and huddle completed with staff and physician.    Outstanding labs, imaging and consults reviewed and identified.    Health maintanence issues identified and addressed:    Health Maintenance   Topic Date Due   . Pneumococcal Vaccine (1 - PCV) Never done   . Hepatitis C Screening  Never done   . COVID-19 Vaccine (3 - Booster for Pfizer series) 03/17/2020   . Cervical Cancer Screening  09/03/2020   . PHQ2 depression screen  02/17/2021   . Influenza (1) 04/03/2021   . Tetanus (3 - Td or Tdap) 03/03/2028   . Polio Vaccine  Aged Out   . HPV Vaccine <= 26 Yrs  Aged Out   . Meningococcal MCV4 Vaccine  Aged Out

## 2021-07-06 NOTE — Progress Notes (Signed)
FAMILY MEDICINE TELEMEDICINE PROGRESS NOTE    CC:    Chief Complaint   Patient presents with   . Other     Sinus infection       SUBJECTIVE:    Angelica Wade is a 35 year old female who is being seen for the following issues:        She complaints of nasal congestion, purulent rhinorrhea, pain/pressure of the face and post nasal drip and cough.    She states that since onset 2 wks ago, the symptoms are gradually worsening.  Associated symptoms include:  Hoarseness of voice, no other symptoms.  She has had similar symptoms in the past.  Patient states in the last 3 times it cleared itself in a week or so but this time it is taking longer.  She is already vaccinated against influenza, she does not believe she has COVID infection but has not been tested, states she has vaccinated against COVID.    Patient reports some relief from otc Allegra.        ROS:    Constitutional: negative for: fatigue, night sweats, weight loss, anorexia, fever.  Resp: negative for:  shortness of breath, pleuritic pain, dyspnea on exertion.          Patient Active Problem List   Diagnosis   . Unspecified mood (affective) disorder (CMS-HCC)   . Chronic low back pain   . Allergic rhinitis   . Insomnia   . Anxiety   . Nexplanon in place   . Adequate exercise, at or above goal       Outpatient Medications Prior to Visit   Medication Sig Dispense Refill   . gabapentin (NEURONTIN) 300 MG capsule TAKE 1 CAPSULE (300 MG) BY MOUTH 3 TIMES DAILY AS NEEDED (PAIN). 90 capsule 3   . gabapentin (NEURONTIN) 300 MG capsule Take 1 capsule (300 mg) by mouth nightly. 90 capsule 3   . LORazepam (ATIVAN) 0.5 MG tablet Take 1 tablet (0.5 mg) by mouth once as needed for Anxiety (prior to MRI) for up to 1 dose. 2 tablet 0   . naproxen (NAPROSYN) 375 MG tablet TAKE 1 TABLET (375 MG) BY MOUTH 2 TIMES DAILY (WITH MEALS). 60 tablet 0   . rizatriptan (MAXALT) 10 MG tablet TAKE 1 TAB ORALLY ONCE AS NEEDED FOR MIGRAINE FOR UP TO 1 DOSE. MAY REPEAT IN 2 HRS IF NEEDED 10  tablet 1     No facility-administered medications prior to visit.           OBJECTIVE:  Physical Exam: General Appearance: healthy, alert, no distress, pleasant affect, cooperative.  Heart/CV:  No cyanosis, clubbing  Lungs:No resp distress, or accessory muscles use, able to complete her sentences.         LABS:  No results found for this or any previous visit.        ASSESSMENT & PLAN:  Angelica Wade is a 35 year old female was seen today for:  Bert was seen today for other.    Diagnoses and all orders for this visit:    Acute recurrent maxillary sinusitis  -     amoxicillin (AMOXIL) 500 MG capsule; Take 1 capsule (500 mg) by mouth 3 times daily.  -Symptomatic therapy suggested: gargle for sore throat, use mist at bedside for congestion.  Apply facial warm packs for sinus pain.  May use acetaminophen, antihistamine-decongestant of choice prn.  -side effect of medication discussed with the patient, also advised her to use a secondary contraceptive  methods while she is taking the antibiotic for a possible interaction.      Chronic allergic rhinitis  -     fluticasone propionate (FLONASE) 50 MCG/ACT nasal spray; Spray 1 spray into each nostril daily.          Health Maintenance   Topic Date Due   . Pneumococcal Vaccine (1 - PCV) Never done   . Hepatitis C Screening  Never done   . COVID-19 Vaccine (3 - Booster for Pfizer series) 03/17/2020   . Cervical Cancer Screening  09/03/2020   . PHQ2 depression screen  02/17/2021   . Influenza (1) 04/03/2021   . Tetanus (3 - Td or Tdap) 03/03/2028   . Polio Vaccine  Aged Out   . HPV Vaccine <= 26 Yrs  Aged Out   . Meningococcal MCV4 Vaccine  Aged Out       No follow-ups on file.    There are no Patient Instructions on file for this visit.    Lurlean Horns, MD      Plan discussed with pt including risks/benefits/alternatives including watchful waiting.  Informed pt of 24/7 on call MD.  ED if acutely worsening after hours.  Pt verbalized understanding.    Medications reviewed  with patient and medication list reconciled.  Over the counter medications, herbal therapies and supplements reviewed.  Patient's understanding and response to medications assessed.    Barriers to medications assessed and addressed.  Risks, benefits, alternatives to medications reviewed.    Total duration of encounter I personally spent 20 total minutes in face-to-face and non-face-to-face activities related to the   patient's visit today, excluding any separately reportable services/procedures.    ---------------------(data below generated by Lurlean Horns, MD)--------------------     Patient Verification & Telemedicine Consent & Financial Waiver:    1.   Identity: I have verified this patient's identity to be accurate.  2.   Consent: I verify consent has been secured in one of the following methods: (a) obtained written/ online attestation consent (via MyChartVideoVisit pathway), (b) the spoke-side provider has obtained verbal or written consent from patient/surrogate (if this is a "provider to provider" evaluation), or (c) in all other cases, I have personally obtained verbal consent from the patient/ surrogate (noting all elements below) to perform this voluntary telemedicine evaluation (including obtaining history, performing examination and reviewing data provided by the patient).   The patient/ surrogate has the right to refuse this evaluation.  I have explained risks (including potential loss of confidentiality), benefits, alternatives, and the potential need for subsequent face to face care. Patient/ surrogate understands that there is a risk of medical inaccuracies given that our recommendations will be made based on reported data (and we must therefore assume this information is accurate).  Knowing that there is a risk that this information is not reported accurately, and that the telemedicine video, audio, or data feed may be incomplete, the patient agrees to proceed with evaluation and holds [place]  harmless knowing these risks.  3.   Healthcare Team: The patient/ surrogate has been notified that other healthcare professionals (including students, residents and Metallurgist) may be involved in this audio-video evaluation.   All laws concerning confidentiality and patient access to medical records and copies of medical records apply to telemedicine.  4.   Privacy: If this is a Radiographer, therapeutic Visit, the patient/ surrogate has received the [place] Notice of Privacy Practices via E-Checkin process.  For all other video visit techniques, I have verbally provided the  patient/ surrogate with the [facility] web link in [place] ([email or web address]://[email or web address]) or Spanish ([email or web address]://[email or web address]://[email or web address].[email or web address]).  The patient/ surrogate acknowledges both being provided the NPP link, and has been offered to have the NPP mailed to the patient/ surrogate by [place] mail.  The patient/ surrogate has voiced understanding an acknowledgement of receipt of this NPP web address.  If the patient/surrogate has elected to receive the NPP via [place] mail, I verify that the NPP will be sent promptly to the patient/surrogate via [place] mail.  5.   Capacity: I have reviewed this above verification and consent paragraph with the patient/ surrogate and the patient is capacitated or has a surrogate. If the patient is not capacitated to understand the above, and no surrogate is available, since this is not an emergency evaluation, the visit will be rescheduled until such time that the patient can consent, or the surrogate is available to consent. If this is an emergency evaluation and the patient is not capacitated to understand the above, and no surrogate is available, I am proceeding with this evaluation as this is felt to be an emergency setting and no appropriate specialist is available at the bedside to perform these evaluations.  6.   Financial Waiver: If this is a Radiographer, therapeutic Visit, the patient has been made aware of the  financial waiver via E-Checkin process.  For all other video visit techniques, an E-Checkin process is not performed.  As such, I have personally verbally informed the patient/ surrogate that this evaluation will be a billable encounter similar to an in-person clinic visit, and the patient/ surrogate has agreed to pay the fee for services rendered.  If we are billing insurance for the patient's telehealth visit, hers out-of-pocket cost will be determined based on hers plan and will be billed to her.  The patient/ surrogate has also been informed that if the patient does not have insurance or does not wish to use insurance, [facility] price for a primary care telehealth visit is $59.00 and specialist telehealth visit is $88.00.  I have further informed the patient/ surrogate that in the event the patient has additional services provided in conjunction with the specialty visit (Ex. Psychotherapy services), those services will be billed at the current rate less a 45% discount.  7.   Intra-State Location: The patient/ surrogate attests to understanding that if the patient accesses these services from a location outside of [place], that the patient does so at the patient's own risk and initiative and that the patient is ultimately responsible for compliance with any laws or regulations associated with the patient's use.  8.   Specific Use:The patient/ surrogate understands that [facility] makes no representation that materials or servicesdelivered via telecommunication services, or listed on telemedicine websites, are appropriate or available for use in any other location.           Demographics:  Medical Record #: [id number]  Date: July 06, 2021  Patient Name: Nevaeh Poindexter  DOB: 1986/01/04  Age: 35 year old  Sex: female  Location: Home address on file     Evaluator(s):  Saline Demetrius was evaluated by me today.    Clinic Location: [facility]  [address] [place] [address]

## 2021-07-20 ENCOUNTER — Other Ambulatory Visit (INDEPENDENT_AMBULATORY_CARE_PROVIDER_SITE_OTHER): Payer: Self-pay | Admitting: Sports Medicine

## 2021-07-20 DIAGNOSIS — G8929 Other chronic pain: Secondary | ICD-10-CM

## 2021-07-20 NOTE — Telephone Encounter (Signed)
naproxen  is not currently included in the Pharmacy Refill Clinic protocols. Re-routing to the responsible staff for processing.  Thank you!

## 2021-07-24 MED ORDER — NAPROXEN 375 MG OR TABS
375.00 mg | ORAL_TABLET | Freq: Two times a day (BID) | ORAL | 0 refills | Status: DC
Start: 2021-07-24 — End: 2021-08-15

## 2021-07-24 NOTE — Telephone Encounter (Signed)
Incoming fax refill request from pharmacy  Medication requested:   Requested Prescriptions     Pending Prescriptions Disp Refills   . naproxen (NAPROSYN) 375 MG tablet [Pharmacy Med Name: NAPROXEN 375 MG TABLET] 60 tablet 0     Sig: TAKE 1 TABLET (375 MG) BY MOUTH 2 TIMES DAILY (WITH MEALS).       Last refill: 06/19/2021  Pharmacy:   [facility] [address] [place] - [address] [facility] AT [address]  [address] [place] [address]  Phone: [phone] Fax: [phone]    Last visit in this department 07/06/2021  Next visit in this department Visit date not found    Last date of urine immunoassay drug screen       Last date of CURES report                        Prescription refill pended and routed to PCP for review/approval if appropriate.

## 2021-07-24 NOTE — Telephone Encounter (Signed)
Covering for primary care provider. Temporary Prescription refilled.    Fatin Bachicha NP

## 2021-08-14 ENCOUNTER — Other Ambulatory Visit (INDEPENDENT_AMBULATORY_CARE_PROVIDER_SITE_OTHER): Payer: Self-pay | Admitting: Sports Medicine

## 2021-08-14 DIAGNOSIS — G8929 Other chronic pain: Secondary | ICD-10-CM

## 2021-08-15 NOTE — Telephone Encounter (Signed)
[facility]     Naproxen is not currently included in the Pharmacy Refill Clinic protocols. Re-routing to the responsible staff for processing.  Thank you

## 2021-08-15 NOTE — Telephone Encounter (Signed)
Incoming fax refill request from pharmacy  Medication requested:   Requested Prescriptions     Pending Prescriptions Disp Refills   . naproxen (NAPROSYN) 375 MG tablet [Pharmacy Med Name: NAPROXEN 375 MG TABLET] 60 tablet 0     Sig: TAKE 1 TABLET BY MOUTH TWICE A DAY WITH MEALS       Last refill: 11.21.2022  [facility]acy:   [facility] [address] [place] - [address] [facility] AT [address]  [address] [place] [address]  Phone: [phone] Fax: [phone]    Last visit in this department 07/06/2021  Next visit in this department Visit date not found    Last date of urine immunoassay drug screen       Last date of CURES report                        Prescription refill pended and routed to PCP for review/approval if appropriate.

## 2021-08-16 MED ORDER — NAPROXEN 375 MG OR TABS
ORAL_TABLET | ORAL | 0 refills | Status: DC
Start: 2021-08-16 — End: 2022-07-09

## 2021-08-16 NOTE — Telephone Encounter (Signed)
Refill signed, will need appt prior to further refills

## 2021-08-27 ENCOUNTER — Other Ambulatory Visit (INDEPENDENT_AMBULATORY_CARE_PROVIDER_SITE_OTHER): Payer: Self-pay | Admitting: Family Practice

## 2021-08-27 DIAGNOSIS — J309 Allergic rhinitis, unspecified: Secondary | ICD-10-CM

## 2021-08-29 NOTE — Telephone Encounter (Signed)
Approved 07/06/2021 for  1 inhaler ( 60 DS )  +2 refills

## 2021-09-19 ENCOUNTER — Other Ambulatory Visit (INDEPENDENT_AMBULATORY_CARE_PROVIDER_SITE_OTHER): Payer: Self-pay | Admitting: Sports Medicine

## 2021-09-19 ENCOUNTER — Encounter (INDEPENDENT_AMBULATORY_CARE_PROVIDER_SITE_OTHER): Payer: Self-pay | Admitting: Clinical

## 2021-09-19 DIAGNOSIS — G8929 Other chronic pain: Secondary | ICD-10-CM

## 2021-09-20 NOTE — Telephone Encounter (Signed)
[facility]     Naproxen is not currently included in the Pharmacy Refill Clinic protocols. Re-routing to the responsible staff for processing.  Thank you

## 2021-09-20 NOTE — Telephone Encounter (Signed)
Incoming call from patient requesting refill  Medication requested:   Requested Prescriptions     Pending Prescriptions Disp Refills   . naproxen (NAPROSYN) 375 MG tablet [Pharmacy Med Name: NAPROXEN 375 MG TABLET] 60 tablet 0     Sig: TAKE 1 TABLET BY MOUTH TWICE A DAY WITH MEALS       Last refill: 08/16/2021  [facility]   [phone] [address] [place] - [address] [facility] AT [address]  [address] [place] [address]  Phone: [phone] Fax: [phone]    Last visit in this department 07/06/2021  Next visit in this department Visit date not found    Last date of urine immunoassay drug screen       Last date of CURES report                        Prescription refill pended and routed to PCP for review/approval if appropriate.

## 2021-10-25 ENCOUNTER — Encounter (INDEPENDENT_AMBULATORY_CARE_PROVIDER_SITE_OTHER): Payer: Self-pay | Admitting: Sports Medicine

## 2021-10-25 DIAGNOSIS — G8929 Other chronic pain: Secondary | ICD-10-CM

## 2021-10-26 NOTE — Telephone Encounter (Signed)
From: [name]  To: Laurence Compton, MD  Sent: 10/25/2021 7:13 PM PST  Subject: Need a new referral     Hi there I need a new referral to your spine and neck clinic I was told by the [facility] clinic with [facility] that they cant help me without a mri and I will not take a mri period . Is there a way you can get it to were I go to [facility] so my doctor can explain what has happened . I really need to be seen .

## 2021-10-26 NOTE — Telephone Encounter (Signed)
Please advise. Orthopedics referral pended. Please sign/change order if appropriate.    Referral from 12/22/20 is expired.

## 2021-10-26 NOTE — Telephone Encounter (Signed)
Patient informed via mychart referral signed.

## 2021-10-26 NOTE — Telephone Encounter (Signed)
Updated ortho spine referral signed

## 2021-11-07 ENCOUNTER — Encounter (INDEPENDENT_AMBULATORY_CARE_PROVIDER_SITE_OTHER): Payer: Self-pay | Admitting: Orthopaedic Surgery

## 2021-11-13 ENCOUNTER — Other Ambulatory Visit (INDEPENDENT_AMBULATORY_CARE_PROVIDER_SITE_OTHER): Payer: Self-pay | Admitting: Sports Medicine

## 2021-11-13 DIAGNOSIS — G8929 Other chronic pain: Secondary | ICD-10-CM

## 2021-11-13 NOTE — Telephone Encounter (Signed)
gabapentin (NEURONTIN) is not currently included in the Pharmacy Refill Clinic protocols. Re-routing to the responsible staff for processing.  Thank you

## 2021-11-14 MED ORDER — GABAPENTIN 300 MG OR CAPS
300.0000 mg | ORAL_CAPSULE | Freq: Three times a day (TID) | ORAL | 0 refills | Status: DC | PRN
Start: 2021-11-14 — End: 2021-12-25

## 2021-11-14 NOTE — Telephone Encounter (Signed)
Pt notified of refill and needed appt for further refills via MyChart

## 2021-11-14 NOTE — Telephone Encounter (Signed)
Incoming fax refill request from pharmacy  Medication requested:   Requested Prescriptions     Pending Prescriptions Disp Refills   . gabapentin (NEURONTIN) 300 MG capsule [Pharmacy Med Name: GABAPENTIN 300 MG CAPSULE] 90 capsule 3     Sig: TAKE 1 CAPSULE (300 MG) BY MOUTH 3 TIMES DAILY AS NEEDED (PAIN).       Last refill: 05/24/2021  [facility]   [facility] [address], [place] - [address] [facility] AT [address]  [address] [place] [address]  Phone: [phone] Fax: [phone]    Last visit in this department 07/06/2021  Next visit in this department Visit date not found    Last date of urine immunoassay drug screen       Last date of CURES report                        Prescription refill pended and routed to PCP for review/approval if appropriate.

## 2021-11-14 NOTE — Telephone Encounter (Signed)
Limited refill given, last visit was >1 year ago, will need follow up evaluation prior to any further refills. Thanks

## 2021-11-15 NOTE — Progress Notes (Unsigned)
Attending Note:   Dr. Edythe Clarity requests Orthopaedic Surgical consultation for the patient Angelica Wade for problems related to had no chief complaint listed for this encounter.     My report follows:    Subjective:   I reviewed the history and medical record, and interviewed and examined the patient.    History of present illness (HPI): 36 year old female here for Orthopaedic Surgical evaluation to treat hers pain and functional impairment. 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    Per PCP note 10-27-21:    #Low back pain with sciatica:  Continues with low back pain and sciatica symptoms that flare up from time to time  Symptoms flared up again starting this past Saturday, no acute injury. Sunday couldn't walk at all due to pain, Monday was a little better and slowly improving from there  Pain same as before, radiating from middle of her back down the right leg  Symptoms flare up every few months or so, or anytime where she overexerts herself. Will last for several days to weeks and gradually improves  Takes gabapentin 300 mg nightly and naproxen during the day to help with symptoms  MRI previously ordered, but had trouble with her insurance and then order expired    Chronic bilateral low back pain with right-sided sciatica  Continues to recurrent episodes right-sided low pain sciatica leg.  During these episodes pain is incapacitating has to lay in bed for several days until the pain improves.  She continues to take gabapentin at night and during the day her symptoms.  Prior x-rays reviewed and were normal.  Due to her continued recurrent symptoms evaluate with more advanced imaging with this time.  Recommend continue with conservative management treatments.  Follow up after MRI is resulted to discuss possible next steps in management.  -     MRI Lumbar Spine W/O Contrast; Future  -     naproxen (NAPROSYN) 375 MG tablet; Take 1  tablet (375 mg) by mouth 2 times daily (with meals).  -     gabapentin (NEURONTIN) 300 MG capsule; Take 1 capsule (300 mg) by mouth nightly.    She relates chronic pain mostly below the waist and in the gluteal area right more than left, radiating about to the proximal lateral hips.  Pain is daily of a 'medium' severity, ranked 5-6/10 today. About twice every other month she will feel severe pain and weakness about the proximal lateral/posterior hip, with pain radiating down the leg, lasting ~2-3 days. She reports a feeling of 'no cushion' and a scraping feeling about a point (where is the greater trochanteric bursa). MEDS gabapentin at night, aleve in the day, has tried other medications over the years. Last had physical therapy over 3 years ago, does not have a particular exercise program for herself in place. In 2016-2017 she was established with a pain management clinic, received shots (which were helpful), px for flexeril, and discussions about other medication options. Pain worse with standing long times, prolonged sitting.    ROS no f/c, weight is stable, no problems eating/voiding/breathing. She relates unable even with medications to tolerate either CT or MRI exams due to panic.    I reviewed the past medical history/problem list, allergies, medications, family history and social history from the [place]   Patient Active Problem List   Diagnosis   . Unspecified mood (affective) disorder (CMS-HCC)   . Chronic  low back pain   . Allergic rhinitis   . Insomnia   . Anxiety   . Nexplanon in place   . Adequate exercise, at or above goal     Allergies   Allergen Reactions   . Shellfish-Derived Products Swelling     Current Outpatient Medications   Medication Sig Dispense Refill   . amoxicillin (AMOXIL) 500 MG capsule Take 1 capsule (500 mg) by mouth 3 times daily. 30 capsule 0   . fluticasone propionate (FLONASE) 50 MCG/ACT nasal spray Spray 1 spray into each nostril daily. 16 g 2   . gabapentin  (NEURONTIN) 300 MG capsule TAKE 1 CAPSULE (300 MG) BY MOUTH 3 TIMES DAILY AS NEEDED (PAIN). 90 capsule 0   . gabapentin (NEURONTIN) 300 MG capsule Take 1 capsule (300 mg) by mouth nightly. 90 capsule 3   . LORazepam (ATIVAN) 0.5 MG tablet Take 1 tablet (0.5 mg) by mouth once as needed for Anxiety (prior to MRI) for up to 1 dose. 2 tablet 0   . naproxen (NAPROSYN) 375 MG tablet TAKE 1 TABLET BY MOUTH TWICE A DAY WITH MEALS 60 tablet 0   . rizatriptan (MAXALT) 10 MG tablet TAKE 1 TAB ORALLY ONCE AS NEEDED FOR MIGRAINE FOR UP TO 1 DOSE. MAY REPEAT IN 2 HRS IF NEEDED 10 tablet 1     No current facility-administered medications for this visit.     Family History   Problem Relation Name Age of Onset   . Bipolar Disorder Mother     . Diabetes Mother     . Heart Disease Mother     . Alcohol/Drug Mother     . Breast Cancer Mother     No hereditary or high-risk disease identified otherwise.    Social History     Socioeconomic History   . Marital status: Married   . Number of children: 2   Tobacco Use   . Smoking status: Some Days     Types: Cigarettes, Electronic Cigarette (Smokeless)   . Smokeless tobacco: Current   . Tobacco comments:     former cigarettes, vapes currently   Substance and Sexual Activity   . Alcohol use: Yes   . Drug use: Never   . Sexual activity: Yes     Partners: Male     Comment: married       Review of Systems: as indicated in the history of present illness and per the intake form. All others reviewed and were negative.    Objective:    11/16/21  0758   BP: 125/78   Pulse: 85   Temp: 97.4 F (36.3 C)     Gen WNWD, Pulm No SOB, Abd no discomfort, CV 1+ pulses PT  MS cooperative and pleasant  HEENT pupils not pinprick  Neuro normal babinski, symmetric DTRs, no clonus on ankle jerk, full strength hf/ke/df/ehl  Spine forward flexion to 45  degrees, facet loading maneuvers painful   MSK  hip ROM painless in the groin, straight leg raise 70 degrees  Greater trochanteric bursa and IT bands bilaterally  tender, with concordant pain  Hip abduction strength 2/3 with pain      Studies    MRI LUMBAR SPINE W/O CONTRAST:11/17/2020 3:36 pm    CLINICAL HISTORY:  36 year old female with back pain and neurologic deficit consistent lumbar spine pathology    TECHNIQUE:  MR imaging of the lumbar spine was performed on a 1.5 Tesla scanner, only scout few since the patient was claustrophobic .  COMPARISON:  None available    FINDINGS:  See impression    CONCURRENT SUPERVISION:  I have reviewed the images and agree with the Resident's interpretation.      Signed by: Rogelio Seen 11/17/2020 15:59:28  IMPRESSION:  IMPRESSION:  Failed MRI of the lumbar spine since the patient was claustrophobic and terminated the study prematurely.      X-RAY LUMBOSACRAL SPINE 2 OR 3 VIEWS    CLINICAL HISTORY:  Right-sided lumbar radiculopathy    COMPARISON:  None    FINDINGS:  Lumbar spine 3 images    There is no fracture, dislocation or acute abnormality. Spinal alignment is normal. The vertebral bodies, disc spaces and posterior elements are normal. No abnormality of the paraspinal soft tissues is present.     Signed by: Delsa Grana 02/18/2020 05:49:45  IMPRESSION:  IMPRESSION:  Normal lumbar spine.      Assessment and plan:     Chronic right more than left proximal posterior hip pain:  Hip deconditioning with hip abductor muscle weakness  Greater trochanteric/iliotibial band pain syndrome  BMI 30.1    This patient does not have a condition amenable to Orthopaedic surgery at this time. In the meanwhile, here are management suggestions we discussed:        1. Xray hips    2. Physical therapy near you        Sample exercises to do at home:      [email or web address]      3. Medication recommendations: The [facility] is a consultation service with regard to medications, focussed on the treatment of acute surgical and  peri-operative pain. We do not take over the writing of routine prescriptions for patients. We are happy to make initial recommendations regarding pain medications for the patient's PCP to consider implementing/continuing.     - no further recommendations  - continue current plan    4.  [facility] consultation    5. Follow up with your PCP Dr Adah Perl      Addendum:  Hip xrays reviewed        Preliminary created by: Ulyses Jarred   Signed by: Lynnae Prude 11/16/2021 10:36:08  IMPRESSION:  IMPRESSION:  Increased sclerosis of the sacroiliac joints, with ill-defined margins suggesting erosions and raising concern for sacroiliitis. Recommend further evaluation with dedicated MRI of the sacroiliac joints.    Unremarkable radiographic appearance of the bilateral hips.    - will order MRI BONE PELVIS  - Rheum consultation followup      I spent 20minutes caring for this patient, including preparing to see the patient, obtaining and reviewing the history, seeing the patient and performing a medically appropriate examination, reviewing diagnostic tests and labs, ordering tests/medications or procedures, providing education, counseling and coordination of care, and documenting clinic information in the record. We reviewed internet anatomy diagrams and reviewed a 3d plastic skeletal model. All questions were answered and Angelica Wade understood and was satisfied with this plan.    We discussed issues as described above. All of the patient's questions were answered to their satisfaction.    Angelica Wade, M.D., Ph.D.  Clinical Professor, [place] Department of Orthopaedic Surgery  Board Certified, Sports Medicine  Chief, Physical Medicine and [employer], Chief Health Officer    [email or web address]    CC: Edythe Clarity

## 2021-11-16 ENCOUNTER — Other Ambulatory Visit (INDEPENDENT_AMBULATORY_CARE_PROVIDER_SITE_OTHER): Admit: 2021-11-16 | Discharge: 2021-11-16 | Disposition: A | Discharge status: Home Health | Payer: TRICARE Prime—HMO

## 2021-11-16 ENCOUNTER — Encounter (HOSPITAL_BASED_OUTPATIENT_CLINIC_OR_DEPARTMENT_OTHER): Payer: Self-pay | Admitting: Hospital

## 2021-11-16 ENCOUNTER — Ambulatory Visit (INDEPENDENT_AMBULATORY_CARE_PROVIDER_SITE_OTHER): Payer: TRICARE Prime—HMO | Admitting: Orthopaedic Surgery

## 2021-11-16 ENCOUNTER — Telehealth (INDEPENDENT_AMBULATORY_CARE_PROVIDER_SITE_OTHER): Payer: Self-pay | Admitting: Orthopaedic Surgery

## 2021-11-16 ENCOUNTER — Encounter (INDEPENDENT_AMBULATORY_CARE_PROVIDER_SITE_OTHER): Payer: Self-pay | Admitting: Orthopaedic Surgery

## 2021-11-16 VITALS — BP 125/78 | HR 85 | Temp 97.4°F

## 2021-11-16 DIAGNOSIS — M7630 Iliotibial band syndrome, unspecified leg: Secondary | ICD-10-CM

## 2021-11-16 DIAGNOSIS — M25551 Pain in right hip: Secondary | ICD-10-CM

## 2021-11-16 DIAGNOSIS — F410 Panic disorder [episodic paroxysmal anxiety] without agoraphobia: Secondary | ICD-10-CM

## 2021-11-16 DIAGNOSIS — M7062 Trochanteric bursitis, left hip: Secondary | ICD-10-CM

## 2021-11-16 DIAGNOSIS — M461 Sacroiliitis, not elsewhere classified: Secondary | ICD-10-CM

## 2021-11-16 DIAGNOSIS — R29898 Other symptoms and signs involving the musculoskeletal system: Secondary | ICD-10-CM

## 2021-11-16 DIAGNOSIS — M25552 Pain in left hip: Secondary | ICD-10-CM

## 2021-11-16 DIAGNOSIS — M7061 Trochanteric bursitis, right hip: Secondary | ICD-10-CM

## 2021-11-16 DIAGNOSIS — M7631 Iliotibial band syndrome, right leg: Secondary | ICD-10-CM

## 2021-11-16 DIAGNOSIS — M539 Dorsopathy, unspecified: Secondary | ICD-10-CM

## 2021-11-16 DIAGNOSIS — Z6830 Body mass index (BMI) 30.0-30.9, adult: Secondary | ICD-10-CM

## 2021-11-16 MED ORDER — LORAZEPAM 1 MG OR TABS
1.0000 mg | ORAL_TABLET | ORAL | 0 refills | Status: DC | PRN
Start: 2021-11-16 — End: 2022-06-04

## 2021-11-16 NOTE — Telephone Encounter (Signed)
If patient calls back please warm transfer to [phone]- [phone]

## 2021-11-16 NOTE — Patient Instructions (Addendum)
It was a pleasure seeing you today. Today, we discussed:     Hip deconditioning with hip abductor muscle weakness  Greater trochanteric/iliotibial band pain syndrome    No Orthopaedic Surgical indications      1. Xray hips    2. Physical therapy near you        Sample exercises to do at home:      [email or web address]      3. Medication recommendations: [facility] Department is a consultation service with regard to medications, focussed on the treatment of acute surgical and peri-operative pain. We do not take over the writing of routine prescriptions for patients. We are happy to make initial recommendations regarding pain medications for the patient's PCP to consider implementing/continuing.     - no further recommendations  - continue current plan    4.  [facility] consultation    5. Follow up with your PCP Dr Donzetta Matters      Your results will be available to you in MyChart. For some tests, you will be able to see the results the same day. I will let you know if there is anything urgent to act upon after I have reviewed all of your tests. Otherwise you can discuss the results at your follow-up visit. Feel free to contact us anytime if you have questions about the results.    Take care!  Sherrie George, MD PhD

## 2021-11-16 NOTE — Telephone Encounter (Addendum)
Patient returned call-     Informed of order for MRI pelvis bone and Rheumatology referral.    Patient requesting information to be sent via Mychart.    Information Sent.      Patient states she is very scared of having to do an MRI - Specially if she is going to be inside the tube. Patient states she panics with MRI Exams.      Called radiology - This MRI will require patient to be inside the tube.

## 2021-11-16 NOTE — Telephone Encounter (Signed)
Advise patient to follow up with Rheumatology first in this case (they may not ultimately need MRI)

## 2021-11-16 NOTE — Addendum Note (Signed)
Addended by: Sherrie George on: 11/16/2021 12:08 PM     Modules accepted: Orders

## 2021-11-16 NOTE — Addendum Note (Signed)
Addended by: Sherrie George on: 11/16/2021 03:41 PM     Modules accepted: Orders

## 2021-11-16 NOTE — Telephone Encounter (Signed)
LOV 11/16/2021- She relates unable even with medications to tolerate either CT or MRI exams due to panic.

## 2021-11-16 NOTE — Telephone Encounter (Signed)
Patient may not need to be in MRI scanner entirely (just hips).    Can px oral sedative if she has a driver

## 2021-11-17 ENCOUNTER — Telehealth (HOSPITAL_BASED_OUTPATIENT_CLINIC_OR_DEPARTMENT_OTHER): Payer: Self-pay

## 2021-11-17 NOTE — Telephone Encounter (Signed)
Pt is calling to schedule appt Rheumatology. No recent labs on file. Per patient she has not had any labs done recently. Informed pt general labs are required prior to scheduling in rheumatology. Pt will contact referring MD for lab order.     Noting as FYI.

## 2021-11-22 ENCOUNTER — Encounter (HOSPITAL_BASED_OUTPATIENT_CLINIC_OR_DEPARTMENT_OTHER): Payer: Self-pay | Admitting: Hospital

## 2021-11-22 NOTE — Telephone Encounter (Signed)
Called Rheumatology to see which labs they needed.   -CMP   -CBC

## 2021-11-22 NOTE — Telephone Encounter (Signed)
Appreciate call- signed orders

## 2021-11-26 ENCOUNTER — Encounter (INDEPENDENT_AMBULATORY_CARE_PROVIDER_SITE_OTHER): Payer: Self-pay | Admitting: Hospital

## 2021-12-22 ENCOUNTER — Other Ambulatory Visit (INDEPENDENT_AMBULATORY_CARE_PROVIDER_SITE_OTHER): Payer: Self-pay | Admitting: Sports Medicine

## 2021-12-22 DIAGNOSIS — G8929 Other chronic pain: Secondary | ICD-10-CM

## 2021-12-22 NOTE — Telephone Encounter (Signed)
[facility]     Gabapentin is not currently included in the Pharmacy Refill Clinic protocols. Re-routing to the responsible staff for processing.  Thank you

## 2021-12-25 MED ORDER — GABAPENTIN 300 MG OR CAPS
300.00 mg | ORAL_CAPSULE | Freq: Three times a day (TID) | ORAL | 0 refills | Status: DC | PRN
Start: 2021-12-25 — End: 2022-01-22

## 2021-12-25 NOTE — Telephone Encounter (Signed)
Last refill, no further refills will be given until she schedules a follow up appointment with me. Thanks

## 2021-12-25 NOTE — Telephone Encounter (Signed)
Patient notified via mychart to schedule appt prior to next refill.

## 2021-12-25 NOTE — Telephone Encounter (Signed)
Interface refill request from pharmacy  Medication requested:   Requested Prescriptions     Pending Prescriptions Disp Refills   . gabapentin (NEURONTIN) 300 MG capsule [Pharmacy Med Name: GABAPENTIN 300 MG CAPSULE] 90 capsule 0     Sig: TAKE 1 CAPSULE (300 MG) BY MOUTH 3 TIMES DAILY AS NEEDED (PAIN).       Last refill: 11/14/21  Pharmacy:   [id number] - [place], [address]  [place] [address]  Phone: [phone] Fax: [phone]    Last visit in this department 07/06/2021  Next visit in this department Visit date not found    Last date of urine immunoassay drug screen       Last date of CURES report                        Prescription refill pended and routed to PCP for review/approval if appropriate.

## 2021-12-26 ENCOUNTER — Telehealth (INDEPENDENT_AMBULATORY_CARE_PROVIDER_SITE_OTHER): Payer: TRICARE Prime—HMO | Admitting: Family Practice

## 2021-12-26 ENCOUNTER — Other Ambulatory Visit: Payer: Self-pay

## 2021-12-26 DIAGNOSIS — Z30017 Encounter for initial prescription of implantable subdermal contraceptive: Secondary | ICD-10-CM

## 2021-12-26 DIAGNOSIS — M5459 Other low back pain: Secondary | ICD-10-CM

## 2021-12-26 DIAGNOSIS — G8929 Other chronic pain: Secondary | ICD-10-CM

## 2021-12-26 NOTE — Progress Notes (Signed)
FAMILY MEDICINE TELEMEDICINE PROGRESS NOTE    CC:    Chief Complaint   Patient presents with   . Other     Referral for Nexplanon       SUBJECTIVE:    Angelica Wade is a 36 year old female who is being seen for the following issues:        HPI by Problem:     1) Contraceptive management: Has Nexplanon in the last 3 years, wants it to be removed and get a new one inserted.  Patient denies having any problem/complications with Nexplanon.    2) chronic low back pain:  Patient has history of chronic low back pain for a year or so, denies having any injuries, she feels muscle spasms, pain also radiates to her lower extremities, patient also has on and off pain in the right hip.  She uses Tylenol p.r.n. she also uses gabapentin regularly mostly around 1 capsules during the night, rarely she needs to use it during the day as well.            Review of Systems:   She denies constitutional symptoms of fatigue, weakness, weight loss or gain, fevers, night sweats.  Patient denies any exertional chest pain, dyspnea, palpitations, syncope, orthopnea, edema or paroxysmal nocturnal dyspnea.  The patient denies cough, chest pain, dyspnea, wheezing or hemoptysis.        Patient Active Problem List   Diagnosis   . Unspecified mood (affective) disorder (CMS-HCC)   . Chronic low back pain   . Allergic rhinitis   . Insomnia   . Anxiety   . Nexplanon in place   . Adequate exercise, at or above goal       Outpatient Medications Prior to Visit   Medication Sig Dispense Refill   . amoxicillin (AMOXIL) 500 MG capsule Take 1 capsule (500 mg) by mouth 3 times daily. 30 capsule 0   . fluticasone propionate (FLONASE) 50 MCG/ACT nasal spray Spray 1 spray into each nostril daily. 16 g 2   . gabapentin (NEURONTIN) 300 MG capsule TAKE 1 CAPSULE (300 MG) BY MOUTH 3 TIMES DAILY AS NEEDED (PAIN). 90 capsule 0   . gabapentin (NEURONTIN) 300 MG capsule Take 1 capsule (300 mg) by mouth nightly. 90 capsule 3   . LORazepam (ATIVAN) 0.5 MG tablet Take 1  tablet (0.5 mg) by mouth once as needed for Anxiety (prior to MRI) for up to 1 dose. 2 tablet 0   . LORazepam (ATIVAN) 1 MG tablet Take 1 tablet (1 mg) by mouth every 20 minutes as needed for Anxiety (/claustrophobia with MRI examination). Place under the tongue and let melt (do not swallow). Must have a driver for the MRI appointment. May repeat x 1 as needed. 2 tablet 0   . naproxen (NAPROSYN) 375 MG tablet TAKE 1 TABLET BY MOUTH TWICE A DAY WITH MEALS 60 tablet 0   . rizatriptan (MAXALT) 10 MG tablet TAKE 1 TAB ORALLY ONCE AS NEEDED FOR MIGRAINE FOR UP TO 1 DOSE. MAY REPEAT IN 2 HRS IF NEEDED 10 tablet 1     No facility-administered medications prior to visit.           OBJECTIVE:  Physical Exam: General Appearance: healthy, alert, no distress, pleasant affect, cooperative.  Heart/CV:  No cyanosis, clubbing  Lungs:No resp distress, or accessory muscles use, able to complete her sentences.'       LABS:  No results found for this or any previous visit.  ASSESSMENT & PLAN:  Angelica Wade is a 36 year old female was seen today for:  Angelica Wade was seen today for other.    Diagnoses and all orders for this visit:    Encounter for initial prescription of implantable subdermal contraceptive  -     [facility]  -     etonogestrel (NEXPLANON) 68 mg implant 1 Implant.  -Ordered referral and Nexplanon, patient follow-up as scheduled to remove current Nexplanon and get a new 1 inserted.      Chronic midline low back pain with sciatica, sciatica laterality unspecified  -Back Muscle strain+ radiculopathy  -Back exercises  -Physical therapy.  -pain control, continue current management.  -f/u as needed.  - Patient to call back for any new or worsening symptoms.          Health Maintenance   Topic Date Due   . Pneumococcal Vaccine (1 - PCV) Never done   . Hepatitis C Screening  Never done   . Universal HIV Screening  Never done   . Lipid Screening  Never done   . COVID-19 Vaccine (3 - Booster for Pfizer series)  03/17/2020   . Cervical Cancer Screening  09/03/2020   . Influenza (1) 04/03/2021   . PHQ2 depression screen  12/27/2022   . Tetanus (3 - Td or Tdap) 03/03/2028   . Polio Vaccine  Aged Out   . HPV Vaccine <= 26 Yrs  Aged Out   . Meningococcal MCV4 Vaccine  Aged Out       No follow-ups on file.    There are no Patient Instructions on file for this visit.    Lurlean Horns, MD      Plan discussed with pt including risks/benefits/alternatives including watchful waiting.  Informed pt of 24/7 on call MD.  ED if acutely worsening after hours.  Pt verbalized understanding.    Medications reviewed with patient and medication list reconciled.  Over the counter medications, herbal therapies and supplements reviewed.  Patient's understanding and response to medications assessed.    Barriers to medications assessed and addressed.  Risks, benefits, alternatives to medications reviewed.      patient's visit today, excluding any separately reportable services/procedures.    ---------------------(data below generated by Lurlean Horns, MD)--------------------     Patient Verification & Telemedicine Consent & Financial Waiver:    1.   Identity: I have verified this patient's identity to be accurate.  2.   Consent: I verify consent has been secured in one of the following methods: (a) obtained written/ online attestation consent (via MyChartVideoVisit pathway), (b) the spoke-side provider has obtained verbal or written consent from patient/surrogate (if this is a "provider to provider" evaluation), or (c) in all other cases, I have personally obtained verbal consent from the patient/ surrogate (noting all elements below) to perform this voluntary telemedicine evaluation (including obtaining history, performing examination and reviewing data provided by the patient).   The patient/ surrogate has the right to refuse this evaluation.  I have explained risks (including potential loss of confidentiality), benefits, alternatives, and  the potential need for subsequent face to face care. Patient/ surrogate understands that there is a risk of medical inaccuracies given that our recommendations will be made based on reported data (and we must therefore assume this information is accurate).  Knowing that there is a risk that this information is not reported accurately, and that the telemedicine video, audio, or data feed may be incomplete, the patient agrees to proceed with evaluation and  holds [place] harmless knowing these risks.  3.   Healthcare Team: The patient/ surrogate has been notified that other healthcare professionals (including students, residents and Metallurgist) may be involved in this audio-video evaluation.   All laws concerning confidentiality and patient access to medical records and copies of medical records apply to telemedicine.  4.   Privacy: If this is a Radiographer, therapeutic Visit, the patient/ surrogate has received the [place] Notice of Privacy Practices via E-Checkin process.  For all other video visit techniques, I have verbally provided the patient/ surrogate with the [place] in [place] ([email or web address]://[email or web address]) or Spanish ([email or web address]://[email or web address]://[email or web address].[email or web address]).  The patient/ surrogate acknowledges both being provided the NPP link, and has been offered to have the NPP mailed to the patient/ surrogate by [place] mail.  The patient/ surrogate has voiced understanding an acknowledgement of receipt of this NPP web address.  If the patient/surrogate has elected to receive the NPP via [place] mail, I verify that the NPP will be sent promptly to the patient/surrogate via [place] mail.  5.   Capacity: I have reviewed this above verification and consent paragraph with the patient/ surrogate and the patient is capacitated or has a surrogate. If the patient is not capacitated to understand the above, and no surrogate is available, since this is not an emergency evaluation, the visit will be rescheduled until such  time that the patient can consent, or the surrogate is available to consent. If this is an emergency evaluation and the patient is not capacitated to understand the above, and no surrogate is available, I am proceeding with this evaluation as this is felt to be an emergency setting and no appropriate specialist is available at the bedside to perform these evaluations.  6.   Financial Waiver: If this is a Radiographer, therapeutic Visit, the patient has been made aware of the financial waiver via E-Checkin process.  For all other video visit techniques, an E-Checkin process is not performed.  As such, I have personally verbally informed the patient/ surrogate that this evaluation will be a billable encounter similar to an in-person clinic visit, and the patient/ surrogate has agreed to pay the fee for services rendered.  If we are billing insurance for the patient's telehealth visit, hers out-of-pocket cost will be determined based on hers plan and will be billed to her.  The patient/ surrogate has also been informed that if the patient does not have insurance or does not wish to use insurance, [place] [employer] price for a primary care telehealth visit is $59.00 and specialist telehealth visit is $88.00.  I have further informed the patient/ surrogate that in the event the patient has additional services provided in conjunction with the specialty visit (Ex. Psychotherapy services), those services will be billed at the current rate less a 45% discount.  7.   Intra-State Location: The patient/ surrogate attests to understanding that if the patient accesses these services from a location outside of [place], that the patient does so at the patient's own risk and initiative and that the patient is ultimately responsible for compliance with any laws or regulations associated with the patient's use.  8.   Specific Use:The patient/ surrogate understands that [facility] makes no representation that materials or servicesdelivered  via telecommunication services, or listed on telemedicine websites, are appropriate or available for use in any other location.           Demographics:  Medical Record #: [id number]  Date: December 26, 2021  Patient Name:  Angelica Wade  DOB: 05/30/1986  Age: 36 year old  Sex: female  Location: Home address on file     Evaluator(s):  Angelica Wade was evaluated by me today.    Clinic Location: [facility]  [address] [place] [address]

## 2021-12-28 ENCOUNTER — Encounter (INDEPENDENT_AMBULATORY_CARE_PROVIDER_SITE_OTHER): Payer: Self-pay | Admitting: Family Practice

## 2021-12-28 MED ORDER — ETONOGESTREL 68 MG SC IMPL
1.0000 | DRUG_IMPLANT | Freq: Once | SUBCUTANEOUS | Status: AC
Start: 2021-12-28 — End: 2021-12-29

## 2021-12-29 ENCOUNTER — Encounter (INDEPENDENT_AMBULATORY_CARE_PROVIDER_SITE_OTHER): Payer: TRICARE Prime—HMO

## 2022-01-19 ENCOUNTER — Ambulatory Visit (INDEPENDENT_AMBULATORY_CARE_PROVIDER_SITE_OTHER): Payer: TRICARE Prime—HMO | Admitting: Family Medicine

## 2022-01-19 ENCOUNTER — Encounter (INDEPENDENT_AMBULATORY_CARE_PROVIDER_SITE_OTHER): Payer: Self-pay | Admitting: Family Medicine

## 2022-01-19 VITALS — BP 116/80 | HR 96 | Temp 98.0°F | Resp 16 | Wt 203.7 lb

## 2022-01-19 DIAGNOSIS — Z3046 Encounter for surveillance of implantable subdermal contraceptive: Secondary | ICD-10-CM

## 2022-01-19 MED ORDER — ETONOGESTREL 68 MG SC IMPL
1.0000 | DRUG_IMPLANT | Freq: Once | SUBCUTANEOUS | Status: AC
Start: 2022-01-19 — End: 2022-01-19
  Administered 2022-01-19: 1 via SUBCUTANEOUS

## 2022-01-19 NOTE — Patient Instructions (Signed)
Learning About Birth Control: The Implant (Nexplanon)    What is the implant?  The implant is used to prevent pregnancy. It's a thin rod about the size of a matchstick that is inserted under the skin (subdermal) on the inside of your arm.    The implant releases the hormone progestin to prevent pregnancy. Progestin prevents pregnancy in these ways: It thickens the mucus in the cervix. This makes it hard for sperm to travel into the uterus. It also thins the lining of the uterus, which makes it harder for a fertilized egg to attach to the uterus. Progestin can sometimes stop the ovaries from releasing an egg each month (ovulation).    The implant prevents pregnancy for 5 years. Once it is put in, you don't have to do anything else to prevent pregnancy.    The implant can only be inserted and removed by your doctor or another trained health professional. These procedures can be done in your doctor's office and only take a few minutes.    Your doctor numbs the area and "injects" the implant under your skin. No cuts are made in your skin. To remove the implant, your doctor numbs the area, makes a small cut in the skin, and pulls the implant out.    How well does it work?  The implant works very well. Fewer than 1 woman out of 100 has an unplanned pregnancy.    The Nexplanon is effective within 3 days if it was put in within 5 days of the first day of your period. You should use back up contraception for 7 days after insertion.     What are the advantages of the implant?  The implant is one of the most effective methods of birth control.   It prevents pregnancy for up to 5 years. You don't have to worry about birth control for this time.   It's safe to use while breast-feeding.   The implant doesn't contain estrogen. So you can use it if you don't want to take estrogen or can't take estrogen because you have certain health problems or concerns.   It may reduce heavy bleeding and cramping.   It's convenient. It is always  providing birth control and cannot be seen. You don't need to remember to take a pill or get a shot. You don't have to interrupt sex to protect against pregnancy.    What are the disadvantages of the implant?  The implant doesn't protect against sexually transmitted infections (STIs), such as herpes or HIV/AIDS. If you aren't sure if your sex partner might have an STI, use a condom to protect against infection.   Irregular bleeding is common with Nexplanon, especially in the first 6-12 months of use. After one year, approximately 20% of women who use Implanon will stop having periods completely. Some women have longer, heavier periods. Some women will have increased spotting between periods. You may find that your periods may be hard to predict.   It may cause mood changes, less interest in sex, or weight gain.  You have to see a doctor to have an implant inserted and removed      Taking care of your implant  You can go back to normal daily activities immediately after the Nexplanon has been put in. After the numbness in your arm wears off, you may have some soreness for a day or two where the Nexplanon was inserted. You can help relieve the discomfort with Tylenol (acetaminophen), Aspirin or Advil (ibuprofen).   If your discomfort worsens or you notice redness spreading on the skin around the insertion site, please call the clinic. There also may be some swelling, bruising, or discoloration for up to two weeks. This is how to care for your arm after Nexplanon is inserted.     Please be aware of signs of infection and know how, when, and where to get medical care if needed.    1. Try not to bump the place where the Nexplanon was put in for a few days.    2. To make sure you don't get an infection where the Nexplanon was put in, keep the large gauze bandage on for 24 hours and keep it dry. Remove the large bandage after 24 hours.    3. Keep the little bandage strip on for 3-5 days, and keep it dry.    4. If you have  any redness or oozing, or anything that concerns you, call the office to have the insertion site checked.    After the incision has healed, you don't have to worry about bumping it or putting pressure on it. You can hold your child, carry books, do housework, or do whatever you usually do.      Warning Signs   Call the clinic if any of the following occurs:    You have bleeding, pus, or increasing redness, or pain at insertion site.    You have fever or chills    The implant comes out or you have concerns about its location.    You have a positive pregnancy test or suspect you might be pregnant.

## 2022-01-19 NOTE — Progress Notes (Deleted)
FM Clinic Progress Note    SUBJECTIVE:  Angelica Wade is a 36 year old female who presents to clinic for:    #***    HISTORY:  Patient Active Problem List   Diagnosis   . Unspecified mood (affective) disorder (CMS-HCC)   . Chronic low back pain   . Allergic rhinitis   . Insomnia   . Anxiety   . Nexplanon in place   . Adequate exercise, at or above goal     Current Outpatient Medications   Medication Instructions   . amoxicillin (AMOXIL) 500 mg, Oral, 3 TIMES DAILY   . fluticasone propionate (FLONASE) 50 MCG/ACT nasal spray 1 spray, Each Naris, DAILY   . gabapentin (NEURONTIN) 300 mg, Oral, NIGHTLY   . gabapentin (NEURONTIN) 300 mg, Oral, 3 TIMES DAILY PRN   . LORazepam (ATIVAN) 0.5 mg, Oral, ONCE PRN   . LORazepam (ATIVAN) 1 mg, Oral, EVERY 20 MIN PRN, Place under the tongue and let melt (do not swallow). Must have a driver for the MRI appointment. May repeat x 1 as needed.   . naproxen (NAPROSYN) 375 MG tablet TAKE 1 TABLET BY MOUTH TWICE A DAY WITH MEALS   . rizatriptan (MAXALT) 10 MG tablet TAKE 1 TAB ORALLY ONCE AS NEEDED FOR MIGRAINE FOR UP TO 1 DOSE. MAY REPEAT IN 2 HRS IF NEEDED     Social Hx: reviewed.  Family Hx: reviewed.    OBJECTIVE:  There were no vitals taken for this visit. There is no height or weight on file to calculate BMI.  Wt Readings from Last 5 Encounters:   12/07/20 77.1 kg (170 lb)   02/29/20 85.7 kg (189 lb)   02/18/20 86.2 kg (190 lb)   01/26/20 85.7 kg (189 lb)   02/09/19 84.8 kg (187 lb)      Blood Pressure   11/16/21 125/78   02/29/20 124/72   02/18/20 121/78   01/26/20 117/76   09/11/19 130/85      Pain Score:      General: {weightchoice:32083::"normal weight"}, NCAT, NAD  HEENT:  MMM, EOMI  Lungs: no increased WOB, no pallor  Cardiovascular:  RRR, no murmurs   Abdomen:  Soft, +BS, no TTP  GU: {Guexam:32084::"not done"}  Extremities:  No peripheral edema  Skin:  No visible rash or lesions on exposed skin  Neuro:  AAOx3, no focal deficits, euthymic    RESULTS:  No results found for  this or any previous visit.    ASSESMENT AND PLAN:   Angelica Wade is a 36 year old female who presents with:    There are no diagnoses linked to this encounter.      F/U:    Patient Instruction: See Patient Education section.     Barriers to learning assessed: Patient verbalizes understanding of teaching and instructions.      Medications reviewed with patient. Risks, benefits, alternatives to medications reviewed.    Pt was discussed with attending Dr. Marline Backbone, MD  [facility]

## 2022-01-19 NOTE — Interdisciplinary (Unsigned)
Pre-visit chart review and huddle completed with staff and physician.    Outstanding labs, imaging and consults reviewed and identified.    Health maintanence issues identified and addressed:    Health Maintenance   Topic Date Due   . Pneumococcal Vaccine (1 - PCV) Never done   . Hepatitis C Screening  Never done   . Universal HIV Screening  Never done   . Lipid Screening  Never done   . COVID-19 Vaccine (3 - Booster for Pfizer series) 03/17/2020   . Cervical Cancer Screening  09/03/2020   . Influenza (Season Ended) 04/03/2022   . PHQ2 depression screen  12/27/2022   . Tetanus (3 - Td or Tdap) 03/03/2028   . Polio Vaccine  Aged Out   . IMM_Hep A Vaccine Series  Aged Out   . HPV Vaccine <= 26 Yrs  Aged Out   . Meningococcal MCV4 Vaccine  Aged Out

## 2022-01-19 NOTE — Progress Notes (Unsigned)
FM Clinic Progress Note    Nexplanon Insertion     Angelica Wade is a 36 year old No obstetric history on file. who presents for Nexplanon insertion.    Current BCM: Nexplanon  LMP: No LMP recorded. Patient has had an implant.  Unprotected intercourse since last menses? NO    A complete discussion of the risks and benefits of Nexplanon use and the details of the insertion procedure was held with the patient.  Patient counseled regarding the risk/benefits/alternatives to Nexplanon. Risks discussed includes: infection, bleeding, pain with placement, prolonged discomfort, damage to surrounding structures including muscles/arteries/veins/nerves/bones, difficult removal, risk of contraception failure, likelihood of irregular persistent spotting. Patient acknowledges risks and side effects.  Reviewed alternative methods of contraception available and patient elects Nexplanon.  All questions were answered.  Consent signed.      Time Out: performed at 3:45ppm.    Correct patient:  yes  Correct procedure:  yes  Correct equipment and/or implants:  yes   Patients allergies were confirmed.     Exam:  BP 116/80 (BP Location: Left arm, BP Patient Position: Sitting, BP cuff size: Large)   Pulse 96   Temp 98 F (36.7 C) (Temporal)   Resp 16   Wt 92.4 kg (203 lb 11.3 oz)   SpO2 96%   BMI 36.08 kg/m   Gen: alert, pleasant, no apparent distress     Procedure:  - patient placed in the supine position with her right (non-dominant) arm flexed at the elbow, externally rotated, and placed with her wrist parallel to her ear   - the insertion site, at the inner side of the right upper arm 10 cm above the medial epicondyle of the humerus, was identified and nexplanon palpated  - site cleaned with Other: chloroprep   - 25ml of 1% lidocaine with epinephrine injected just under the skin along the planned insertion tunnel   - incision was made paralell to the implant with a sterile 11 blade  - implant grasped and removed without  difficulty   - steri strips applied   - pressure dressing  applied to the insertion site.  - counter-traction was applied to the skin around the insertion, and the rod inserted in the usual fashion without complications   - 2 steri-strips and 4X4 gauze with Kerlix wrapped around the arm was applied to the insertion site.    The user card was filled out and given to the patient to keep.  The Patient Chart Label was completed and sent for scanning.    Assessment and plan:  Successful Nexplanon removal and placement  - instructed to remove the pressure bandage in several hours and keep the band-aid on for 24 hours. Steristrips to be removed in 3-5 days.  - instructed to use back up method x 7 days   - reminded that Nexplanon does not provide STI protection, recommended condom use  - follow-up prn    Angelica Backbone, MD PGY-3  [facility]

## 2022-01-20 ENCOUNTER — Other Ambulatory Visit (INDEPENDENT_AMBULATORY_CARE_PROVIDER_SITE_OTHER): Payer: Self-pay | Admitting: Sports Medicine

## 2022-01-20 ENCOUNTER — Encounter (INDEPENDENT_AMBULATORY_CARE_PROVIDER_SITE_OTHER): Payer: Self-pay | Admitting: Family Medicine

## 2022-01-20 DIAGNOSIS — G8929 Other chronic pain: Secondary | ICD-10-CM

## 2022-01-22 ENCOUNTER — Encounter (INDEPENDENT_AMBULATORY_CARE_PROVIDER_SITE_OTHER): Payer: Self-pay

## 2022-01-22 MED ORDER — GABAPENTIN 300 MG OR CAPS
300.00 mg | ORAL_CAPSULE | Freq: Three times a day (TID) | ORAL | 0 refills | Status: DC | PRN
Start: 2022-01-22 — End: 2022-03-22

## 2022-01-22 NOTE — Telephone Encounter (Signed)
Patient is requesting refill via MyChart  Medication requested:   Requested Prescriptions     Pending Prescriptions Disp Refills   . gabapentin (NEURONTIN) 300 MG capsule [Pharmacy Med Name: GABAPENTIN 300 MG CAPSULE] 90 capsule 0     Sig: TAKE 1 CAPSULE (300 MG) BY MOUTH 3 TIMES DAILY AS NEEDED (PAIN).       Last refill: 12/25/20  Pharmacy:   [facility] [address], [place] - [address] [address]  [address] [place] [address]  Phone: [phone] Fax: [phone]    Last visit in this department 01/19/2022  Next visit in this department Visit date not found    Last date of urine immunoassay drug screen       Last date of CURES report                        Prescription refill pended and routed to PCP for review/approval if appropriate.

## 2022-01-22 NOTE — Telephone Encounter (Signed)
gabapentin (NEURONTIN) is not currently included in the Pharmacy Refill Clinic protocols. Re-routing to the responsible staff for processing.  Thank you

## 2022-01-24 ENCOUNTER — Ambulatory Visit (INDEPENDENT_AMBULATORY_CARE_PROVIDER_SITE_OTHER): Payer: Self-pay | Admitting: Sports Medicine

## 2022-01-24 NOTE — Telephone Encounter (Signed)
Symptom Call         What symptom is the patient experiencing? PT had procedure done on Friday,nexplanon was replaced. Started to feel symptoms but thought it might be normal.Going on day 5 with burning, stinging itching sensation.Also c/o arm hurting.  Is this a new or ongoing symptom? new  When did the symptom(s) begin? 5 days  Who is reporting the symptoms? Incoming call from patient    Insurance Coverage Verified: yes  Insurance Plan Name:  [employer]  Assigned to [place]: yes  Next office visit:  Visit date not found  Did you offer Express Care/Urgent Care: (if met criteria for Same Day/3 Day scheduling but declines to schedule) no    Transferred call to: Warm Symptoms Line (ext [phone])    Best way to contact patient: [phone] (mobile)   Alternative communication method: [phone] (mobile)       P CARE NAV TRIAGE POOL [ [id number] ]

## 2022-01-24 NOTE — Telephone Encounter (Signed)
PROVIDER ACTION REQUESTED: No, FYI only   Action item needed:  No  Appt scheduled:   Future Appointments   Date Time Provider Department Center   01/25/2022  9:00 AM Dekay, Amie [place], MD [facility]       Disposition: See PCP When Office is Open (Within 3 Days)    Chief Complaint  R upper arm pain x 5 days  Assessment details:        6/10 burning/stinging R upper arm pain at the insertion site of nexplanon site.   Bruising noted at the site, itching noted.  Denies neck pain, swelling, rash, fever, numbness, weakness    Pt has not experienced this before with her previous nexplanon procedure.  Office appt scheduled.  [place]/ER precautions advised.    Advice given, Acute appt scheduled and pt given strict ED precautions and reviewed all applicable Home Care Advice per protocol.     COVID VACCINATION STATUS:   Current Covid Vaccinations   Administered Date(s) Administered   . COVID-19 [employer]) Purple Cap >= 12 Years 12/31/2019, 01/21/2020       Reason for Call: Post-Procedure Call and Arm Pain       Reason for Disposition  . [1] MODERATE pain (e.g., interferes with normal activities) AND [2] present > 3 days    Additional Information  . Negative: Shock suspected (e.g., cold/pale/clammy skin, too weak to stand, low BP, rapid pulse)  . Negative: [1] Similar pain previously AND [2] it was from "heart attack"  . Negative: [1] Similar pain previously AND [2] it was from "angina" AND [3] not relieved by nitroglycerin  . Negative: Sounds like a life-threatening emergency to the triager  . Negative: Followed an arm injury  . Negative: Chest pain  . Negative: Pain, redness, or swelling at intravenous (IV) site or along course of vein  . Negative: Wound looks infected  . Negative: Elbow pain is main symptom  . Negative: Wrist pain is main symptom  . Negative: Difficulty breathing or unusual sweating (e.g., sweating without exertion)  . Negative: [1] Age > 40 AND [2] associated chest or jaw pain AND [3] pain lasts > 5 minutes  .  Negative: [1] Age > 40 AND [2] no obvious cause AND [3] pain even when not moving the arm    (Exception: pain is clearly made worse by moving arm or bending neck)  . Negative: [1] SEVERE pain AND [2] not improved 2 hours after pain medicine  . Negative: [1] Red area or streak AND [2] fever  . Negative: [1] Swollen joint AND [2] fever  . Negative: Patient sounds very sick or weak to the triager  . Negative: [1] Red area or streak AND [2] large (> 2 in. or 5 cm)  . Negative: Entire arm is swollen  . Negative: [1] Cast on wrist or arm AND [2] now increased pain  . Negative: Weakness (i.e., loss of strength) in hand or fingers     (Exception: not truly weak; hand feels weak because of pain)  . Negative: [1] Arm pains with exertion (e.g., walking) AND [2] pain goes away on resting AND [3] not present now  . Negative: [1] Painful rash AND [2] multiple small blisters grouped together (i.e., dermatomal distribution or "band" or "stripe")  . Negative: Looks like a boil, infected sore, deep ulcer or other infected rash (spreading redness, pus)  . Negative: [1] Localized rash is very painful AND [2] no fever  . Negative: Localized pain, redness or hard  lump along vein  . Negative: Numbness (i.e., loss of sensation) in hand or fingers    Answer Assessment - Initial Assessment Questions  1. ONSET: "When did the pain start?"      5 days ago    2. LOCATION: "Where is the pain located?"      R upper arm    3. PAIN: "How bad is the pain?" (Scale 1-10; or mild, moderate, severe)    - MILD (1-3): doesn't interfere with normal activities    - MODERATE (4-7): interferes with normal activities (e.g., work or school) or awakens from sleep    - SEVERE (8-10): excruciating pain, unable to do any normal activities, unable to hold a cup of water      6/10 burning/stinging R upper arm pain at the insertion site    4. WORK OR EXERCISE: "Has there been any recent work or exercise that involved this part of the body?"      Denies    5. CAUSE:  "What do you think is causing the arm pain?"      S/p after nexplanon insertion placed 5 days ago. Pt has not experienced this before with her previous nexplanon procedure    6. OTHER SYMPTOMS: "Do you have any other symptoms?" (e.g., neck pain, swelling, rash, fever, numbness, weakness)      Bruising noted at the site, itching noted.    7. PREGNANCY: "Is there any chance you are pregnant?" "When was your last menstrual period?"      implant    Protocols used: ARM PAIN-A-AH

## 2022-01-25 ENCOUNTER — Encounter (INDEPENDENT_AMBULATORY_CARE_PROVIDER_SITE_OTHER): Payer: Self-pay | Admitting: Student in an Organized Health Care Education/Training Program

## 2022-01-25 ENCOUNTER — Ambulatory Visit (INDEPENDENT_AMBULATORY_CARE_PROVIDER_SITE_OTHER): Payer: TRICARE Prime—HMO | Admitting: Student in an Organized Health Care Education/Training Program

## 2022-01-25 VITALS — BP 116/80 | HR 95 | Temp 97.1°F | Resp 18 | Ht 63.0 in | Wt 203.0 lb

## 2022-01-25 DIAGNOSIS — L299 Pruritus, unspecified: Secondary | ICD-10-CM

## 2022-01-25 DIAGNOSIS — Z975 Presence of (intrauterine) contraceptive device: Secondary | ICD-10-CM

## 2022-01-25 MED ORDER — TRIAMCINOLONE ACETONIDE 0.1 % EX CREA
1.0000 | TOPICAL_CREAM | Freq: Two times a day (BID) | CUTANEOUS | 0 refills | Status: AC
Start: 2022-01-25 — End: ?

## 2022-01-25 NOTE — Progress Notes (Signed)
[facility] Clinic Progress Note    SUBJECTIVE:  Angelica Wade is a 36 year old female who presents to clinic for:    #Nexplanon  Nexplanon removal and replacement last week without complication.     Since last week patient has had intense itching in the area of her nexplanon. This is the primary reason she is seeking apt. Still easily palpable. Pain occurred the day after the procedure. Removed coban the next morning. Kept bandage on for 3 days. Minimal if any pain at rest now but with touch the nexplanon is tender especially at the proximal end. She says he will occasionally get a burning pain at that spot.    Patient mostly wants to ensure everything looks okay. Encouraged to come in by her husband since last time she had relatively few symptoms.     No fever, or distal paresthesias.     HISTORY:  Patient Active Problem List   Diagnosis   . Unspecified mood (affective) disorder (CMS-HCC)   . Chronic low back pain   . Allergic rhinitis   . Insomnia   . Anxiety   . Nexplanon in place   . Adequate exercise, at or above goal     Current Outpatient Medications   Medication Instructions   . amoxicillin (AMOXIL) 500 mg, Oral, 3 TIMES DAILY   . fluticasone propionate (FLONASE) 50 MCG/ACT nasal spray 1 spray, Each Naris, DAILY   . gabapentin (NEURONTIN) 300 mg, Oral, NIGHTLY   . gabapentin (NEURONTIN) 300 mg, Oral, 3 TIMES DAILY PRN   . LORazepam (ATIVAN) 0.5 mg, Oral, ONCE PRN   . LORazepam (ATIVAN) 1 mg, Oral, EVERY 20 MIN PRN, Place under the tongue and let melt (do not swallow). Must have a driver for the MRI appointment. May repeat x 1 as needed.   . naproxen (NAPROSYN) 375 MG tablet TAKE 1 TABLET BY MOUTH TWICE A DAY WITH MEALS   . rizatriptan (MAXALT) 10 MG tablet TAKE 1 TAB ORALLY ONCE AS NEEDED FOR MIGRAINE FOR UP TO 1 DOSE. MAY REPEAT IN 2 HRS IF NEEDED     Social Hx: reviewed.  Family Hx: reviewed.    OBJECTIVE:  There were no vitals taken for this visit. There is no height or weight on file to calculate BMI.  Wt  Readings from Last 5 Encounters:   01/19/22 92.4 kg (203 lb 11.3 oz)   12/07/20 77.1 kg (170 lb)   02/29/20 85.7 kg (189 lb)   02/18/20 86.2 kg (190 lb)   01/26/20 85.7 kg (189 lb)      Blood Pressure   01/19/22 116/80   11/16/21 125/78   02/29/20 124/72   02/18/20 121/78   01/26/20 117/76      Pain Score:      General: overweight, NCAT, NAD  HEENT:  MMM, EOMI  Lungs: no increased WOB, no pallor  Cardiovascular:  RRR, no murmurs   Abdomen:  Soft, +BS, no TTP  GU: not done  Extremities:  No peripheral edema  Skin:  No visible rash or lesions on exposed skin  Neuro:  AAOx3, no focal deficits, euthymic    Right arm:  Bruising over the site of nexplanon. No erythema.  Easily palpated. No warmth.    RESULTS:  No results found for this or any previous visit.    ASSESMENT AND PLAN:   Angelica Wade is a 36 year old female who presents with:    Bliss was seen today for follow up.    Diagnoses  and all orders for this visit:    Nexplanon in place  Pruritus  Biggest concern is pruritus at the site of the implant. No signs of infection. Appropriate placement subq on exam. Overall exam with routine healing and very encouraging. However, will work to control symptoms of pruritis with steroid and OTC antihistamines. Can follow up as needed.   -     triamcinolone (KENALOG) 0.1 % cream; Apply 1 Application. topically 2 times daily. Apply a thin layer as directed      F/U:    Patient Instruction: See Patient Education section.     Barriers to learning assessed: Patient verbalizes understanding of teaching and instructions.      Medications reviewed with patient. Risks, benefits, alternatives to medications reviewed.    Pt was discussed with attending Dr. Lenis Dickinson, MD  [facility]

## 2022-01-25 NOTE — Patient Instructions (Addendum)
Plan:    1) Trial oral antihistamine as needed (Allegra, Zyrtec, Benadryl - at night)    2) Triamcinolone 1% Cream Twice Daily    Follow up as needed if stinging pain persists.    Dr Roger Kill

## 2022-01-31 ENCOUNTER — Other Ambulatory Visit (INDEPENDENT_AMBULATORY_CARE_PROVIDER_SITE_OTHER): Payer: Self-pay | Admitting: Sports Medicine

## 2022-01-31 DIAGNOSIS — G43109 Migraine with aura, not intractable, without status migrainosus: Secondary | ICD-10-CM

## 2022-01-31 MED ORDER — RIZATRIPTAN BENZOATE 10 MG OR TABS
10.0000 mg | ORAL_TABLET | Freq: Once | ORAL | 2 refills | Status: DC | PRN
Start: 2022-01-31 — End: 2022-05-15

## 2022-01-31 NOTE — Telephone Encounter (Signed)
Patient is requesting refill via MyChart  Medication requested:   Requested Prescriptions     Pending Prescriptions Disp Refills   . rizatriptan (MAXALT) 10 MG tablet 10 tablet 1     Sig: May repeat in 2 hours in needed       Last refill: 10/27/21  Pharmacy:   [facility] [address], [place] - [address] [facility] [address]  [address] [place] [address]  Phone: [phone] Fax: [phone]    Last visit in this department 01/25/2022 w de dekay   Next visit in this department Visit date not found    Last date of urine immunoassay drug screen       Last date of CURES report                        Prescription refill pended and routed to PCP for review/approval if appropriate.

## 2022-01-31 NOTE — Telephone Encounter (Signed)
[place] [facility]     Maxalt is not currently included in the Pharmacy Refill Clinic protocols. Re-routing to the responsible staff for processing.  Thank you

## 2022-03-21 ENCOUNTER — Other Ambulatory Visit (INDEPENDENT_AMBULATORY_CARE_PROVIDER_SITE_OTHER): Payer: Self-pay | Admitting: Sports Medicine

## 2022-03-21 DIAGNOSIS — G8929 Other chronic pain: Secondary | ICD-10-CM

## 2022-03-22 MED ORDER — GABAPENTIN 300 MG OR CAPS
300.00 mg | ORAL_CAPSULE | Freq: Three times a day (TID) | ORAL | 3 refills | Status: DC | PRN
Start: 2022-03-22 — End: 2022-07-23

## 2022-03-22 NOTE — Telephone Encounter (Signed)
[place] [facility]     gabapentin  is not currently included in the Pharmacy Refill Clinic protocols. Re-routing to the responsible staff for processing.  Thank you.

## 2022-03-22 NOTE — Telephone Encounter (Signed)
Interface refill request from pharmacy  Medication requested:   Requested Prescriptions     Pending Prescriptions Disp Refills   . gabapentin (NEURONTIN) 300 MG capsule [Pharmacy Med Name: GABAPENTIN 300 MG CAPSULE] 90 capsule 0     Sig: TAKE 1 CAPSULE (300 MG) BY MOUTH 3 TIMES DAILY AS NEEDED (PAIN).       Last refill: 01/22/22  Pharmacy:   [facility] [address] [place] - [address] [facility] [address]  [address] [place] [address]  Phone: [phone] Fax: [phone]    Last visit in this department 01/25/2022  Next visit in this department Visit date not found    Last date of urine immunoassay drug screen       Last date of CURES report                        Prescription refill pended and routed to PCP for review/approval if appropriate.

## 2022-04-17 ENCOUNTER — Ambulatory Visit (INDEPENDENT_AMBULATORY_CARE_PROVIDER_SITE_OTHER): Payer: TRICARE Prime—HMO

## 2022-04-17 ENCOUNTER — Ambulatory Visit (INDEPENDENT_AMBULATORY_CARE_PROVIDER_SITE_OTHER): Payer: Self-pay | Admitting: Sports Medicine

## 2022-04-17 NOTE — Telephone Encounter (Signed)
PROVIDER ACTION REQUESTED: No, FYI only   Action item needed:  none  Appt scheduled: Yes  Future Appointments   Date Time Provider Department Center   04/18/2022 11:00 AM Ellin Mayhew, NP [facility]   04/19/2022  9:50 AM [facility]     Disposition: See PCP Within 24 Hours    COVID Testing Status: n/a   COVID TRIAGE PROTOCOL: n/a     Chief Complaint  intermittent abdominal pain x 2 months  Assessment details:  Patient endorses moderate middle abdominal pain whenever she eats greasy food. It can also make her have diarrhea and vomiting. Patient endorses the last time she had this episode was 2 days ago, they ate out and it was greasy food. Patient states she had mild to moderate abdominal pain and felt sick to her stomach. Patient currently denies any symptoms but would like to speak to a provider    CLINIC/RN/LVN ACTION REQUEST: NO, FYI Only  Action item needed: NO, FYI Only    Advice given, Acute appt scheduled, and pt given strict [place]/ED precautions and reviewed all applicable Home Care Advice per protocol.     COVID VACCINATION STATUS:   Current Covid Vaccinations   Administered Date(s) Administered    COVID-19 [employer]) Purple Cap >= 12 Years 12/31/2019, 01/21/2020       Reason for Call: Symptoms     Reason for Disposition   [1] MODERATE pain (e.g., interferes with normal activities) AND [2] pain comes and goes (cramps) AND [3] present > 24 hours  (Exception: pain with Vomiting or Diarrhea - see that Guideline)    Additional Information   Negative: Shock suspected (e.g., cold/pale/clammy skin, too weak to stand, low BP, rapid pulse)   Negative: Difficult to awaken or acting confused (e.g., disoriented, slurred speech)   Negative: Passed out (i.e., lost consciousness, collapsed and was not responding)   Negative: Sounds like a life-threatening emergency to the triager   Negative: Chest pain   Negative: Pain is mainly in upper abdomen  (if needed ask: "is it mainly above the belly button?")    Negative: Followed an abdomen (stomach) injury   Negative: [1] Abdominal pain AND [2] pregnant < 20 weeks   Negative: [1] Abdominal pain AND [2] pregnant 20 or more weeks   Negative: [1] Abdominal pain AND [2] postpartum (from 0 to 6 weeks after delivery)   Negative: [1] SEVERE pain (e.g., excruciating) AND [2] present > 1 hour   Negative: [1] SEVERE pain AND [2] age > 60 years   Negative: [1] Vomiting AND [2] contains red blood or black ("coffee ground") material  (Exception: few red streaks in vomit that only happened once)   Negative: Blood in bowel movements (Exception: blood on surface of BM with constipation)   Negative: Black or tarry bowel movements (Exception: chronic-unchanged black-grey bowel movements AND is taking iron pills or Pepto-bismol)   Negative: Patient sounds very sick or weak to the triager   Negative: [1] MILD-MODERATE pain AND [2] constant AND [3] present > 2 hours   Negative: [1] Vomiting AND [2] abdomen looks much more swollen than usual   Negative: [1] Vomiting AND [2] contains bile (green color)   Negative: White of the eyes have turned yellow (i.e., jaundice)   Negative: Fever > 103 F (39.4 C)   Negative: [1] Fever > 101 F (38.3 C) AND [2] age > 60 years   Negative: [1] Fever > 100.0 F (37.8 C) AND [2] bedridden (  e.g., nursing home patient, CVA, chronic illness, recovering from surgery)   Negative: [1] Fever > 100.0 F (37.8 C) AND [2] diabetes mellitus or weak immune system (e.g., HIV positive, cancer chemo, splenectomy, organ transplant, chronic steroids)   Negative: [1] SEVERE pain AND [2] present < 1 hour    Answer Assessment - Initial Assessment Questions  Patient endorses moderate middle abdominal pain whenever she eats greasy food. It can also make her have diarrhea and vomiting. Patient endorses the last time she had this episode was 2 days ago, they ate out and it was greasy food. Patient states she had mild to moderate abdominal pain and felt sick to her stomach. Patient  currently denies any symptoms but would like to speak to a provider    Protocols used: Abdominal Pain - [facility]

## 2022-04-17 NOTE — Telephone Encounter (Signed)
Symptom Call         What symptom is the patient experiencing?   Pain underneath her ribs ,severe pain ,  when she eats greasy foods she gets diarrhea, vomiting at times       Is this a new or ongoing symptom? new  When did the symptom(s) begin? 2 months -getting worse   Who is reporting the symptoms? Incoming call from patient    Insurance Coverage Verified: yes  Insurance Plan Name:  tricare prime  Assigned to [place]: yes  Next office visit:  Visit date not found  Did you offer Express Care/Urgent Care: no    Transferred call to: Warm Symptoms Line (ext [id number])  [phone]   Best way to contact patient:  [phone]

## 2022-04-18 ENCOUNTER — Ambulatory Visit (INDEPENDENT_AMBULATORY_CARE_PROVIDER_SITE_OTHER): Payer: TRICARE Prime—HMO | Admitting: Nurse Practitioner

## 2022-04-18 ENCOUNTER — Other Ambulatory Visit: Payer: TRICARE Prime—HMO | Attending: Nurse Practitioner

## 2022-04-18 VITALS — BP 114/75 | HR 76 | Temp 98.4°F | Ht 63.0 in | Wt 199.7 lb

## 2022-04-18 DIAGNOSIS — M461 Sacroiliitis, not elsewhere classified: Secondary | ICD-10-CM | POA: Insufficient documentation

## 2022-04-18 DIAGNOSIS — R197 Diarrhea, unspecified: Secondary | ICD-10-CM

## 2022-04-18 DIAGNOSIS — R112 Nausea with vomiting, unspecified: Secondary | ICD-10-CM

## 2022-04-18 DIAGNOSIS — M25551 Pain in right hip: Secondary | ICD-10-CM | POA: Insufficient documentation

## 2022-04-18 DIAGNOSIS — R109 Unspecified abdominal pain: Secondary | ICD-10-CM

## 2022-04-18 DIAGNOSIS — M25552 Pain in left hip: Secondary | ICD-10-CM | POA: Insufficient documentation

## 2022-04-18 LAB — CBC WITH DIFF, BLOOD
ANC-Automated: 7.6 10*3/uL — ABNORMAL HIGH (ref 1.6–7.0)
Abs Basophils: 0.1 10*3/uL (ref <0.2)
Abs Eosinophils: 0.1 10*3/uL (ref 0.0–0.5)
Abs Lymphs: 3 10*3/uL (ref 0.8–3.1)
Abs Monos: 0.6 10*3/uL (ref 0.2–0.8)
Basophils: 0 %
Eosinophils: 1 %
Hct: 41.7 % (ref 34.0–45.0)
Hgb: 14.4 gm/dL (ref 11.2–15.7)
Lymphocytes: 26 %
MCH: 29.3 pg (ref 26.0–32.0)
MCHC: 34.5 g/dL (ref 32.0–36.0)
MCV: 84.8 um3 (ref 79.0–95.0)
MPV: 10.8 fL (ref 9.4–12.4)
Monocytes: 6 %
Plt Count: 346 10*3/uL (ref 140–370)
RBC: 4.92 10*6/uL (ref 3.90–5.20)
RDW: 12.4 % (ref 12.0–14.0)
Segs: 67 %
WBC: 11.4 10*3/uL — ABNORMAL HIGH (ref 4.0–10.0)

## 2022-04-18 LAB — LIPASE, BLOOD: Lipase: 28 U/L (ref 13–60)

## 2022-04-18 LAB — URINALYSIS WITH CULTURE REFLEX, WHEN INDICATED
Bilirubin: NEGATIVE
Blood: NEGATIVE
Glucose: NEGATIVE
Ketones: NEGATIVE
Leuk Esterase: 25 Leu/uL — AB
Nitrite: NEGATIVE
Specific Gravity: 1.032 — ABNORMAL HIGH (ref 1.002–1.030)
Urobilinogen: NEGATIVE
pH: 6 (ref 5.0–8.0)

## 2022-04-18 LAB — GASTROINTESTINAL PATHOGEN NUCLEIC ACID DETECTION TEST
Adenovirus F (40/41) PCR, Stool: NOT DETECTED
Astrovirus PCR, Stool: NOT DETECTED
Campylobacter Group PCR, Stool: NOT DETECTED
Cryptosporidium species PCR, Stool: NOT DETECTED
Cyclospora cayetanensis PCR, Stool: NOT DETECTED
Entamoeba histolytica PCR, Stool: NOT DETECTED
Enteroaggregative E. coli (EAEC) PCR, Stool: NOT DETECTED
Enteropathogenic E. coli (EPEC) PCR, Stool: NOT DETECTED
Enterotoxigenic E. coli (ETEC) PCR, Stool: NOT DETECTED
Giardia lamblia PCR, Stool: NOT DETECTED
Norovirus GI/GII PCR, Stool: NOT DETECTED
Plesiomonas shigelloides PCR, Stool: NOT DETECTED
Rotavirus A PCR, Stool: NOT DETECTED
Salmonella Species PCR, Stool: NOT DETECTED
Sapovirus (I, II, IV, and V) PCR, Stool: NOT DETECTED
Shiga-like toxin-producing E. coli (STEC) PCR, Stool: NOT DETECTED
Shigella species/EIEC PCR, Stool: NOT DETECTED
Vibrio Group PCR, Stool: NOT DETECTED
Vibrio cholerae PCR, Stool: NOT DETECTED
Yersinia enterocolitica PCR, Stool: NOT DETECTED

## 2022-04-18 LAB — COMPREHENSIVE METABOLIC PANEL, BLOOD
ALT (SGPT): 12 U/L (ref 0–33)
AST (SGOT): 14 U/L (ref 0–32)
Albumin: 4.3 g/dL (ref 3.5–5.2)
Alkaline Phos: 68 U/L (ref 40–130)
Anion Gap: 10 mmol/L (ref 7–15)
BUN: 11 mg/dL (ref 6–20)
Bicarbonate: 23 mmol/L (ref 22–29)
Bilirubin, Tot: 0.4 mg/dL (ref <1.2)
Calcium: 9.8 mg/dL (ref 8.5–10.6)
Chloride: 105 mmol/L (ref 98–107)
Creatinine: 0.88 mg/dL (ref 0.51–0.95)
Glucose: 93 mg/dL (ref 70–99)
Potassium: 4.1 mmol/L (ref 3.5–5.1)
Sodium: 138 mmol/L (ref 136–145)
Total Protein: 8 g/dL (ref 6.0–8.0)
eGFR Based on CKD-EPI 2021 Equation: >60 mL/min/{1.73_m2}

## 2022-04-18 LAB — C-REACTIVE PROTEIN, BLOOD: CRP: 0.96 mg/dL — ABNORMAL HIGH (ref <0.5)

## 2022-04-18 LAB — RF (RHEUMATOID FACTOR), BLOOD: RF: <10 [IU]/mL (ref 0–13)

## 2022-04-18 LAB — SED RATE, BLOOD: Sed Rate: 21 mm/hr — ABNORMAL HIGH (ref 0–20)

## 2022-04-18 NOTE — Interdisciplinary (Signed)
Dropped off stool

## 2022-04-18 NOTE — Progress Notes (Unsigned)
Chief Complaint   Patient presents with    Back Pain     Lower back pain        Subjective:   Angelica Wade is a(n) 36 year old female with a past medical history noted below who presents for :    Right mid back pain x 2 months.   Radiates to right lateral abdomen  Intermittent and occurs with greasy foods (ate tacos three nights ago when symptoms last occurred).  Diarrhea on Sunday morning. One episode. Vomited about two hours after she had diarrhea. Denies further occurrences.  Denies nausea at this time.   Some fatigue.   Hx of GERD but this feels different.   Fhx of kidney stones and gallstones with removal of gallbladder. Denies personal history.  LMP 2 months has nexplanon in place.   Hx of chronic LBP and hip pain but this feels different. F/b Dr. Chang from physical medicine and rehab department. Gabapentin for chronic pain but still felt pain four nights ago      ROS:  Constitutional: negative, fatigue, weight loss, malaise, anorexia, fever.  CV: negative, palpitations, chest pain.  Resp: negative, cough, hemoptysis, shortness of breath.  GI: negative, dysphagia, melena, hematochezia, constipation, jaundice, see HPI.  GU: negative, dysuria, frequency, hematuria, retention, urgency, + R flank pain.  Musculoskeletal: + chronic LBP.  GYN: + nexplanon in place with intermittent spotting which is her baseline with nexplanon.    History:  Patient Active Problem List   Diagnosis    Unspecified mood (affective) disorder (CMS-HCC)    Chronic low back pain    Allergic rhinitis    Insomnia    Anxiety    Nexplanon in place    Adequate exercise, at or above goal     Past Medical History:   Diagnosis Date    Allergic rhinitis     Bipolar affective disorder (CMS-HCC)     Chronic low back pain      Past Surgical History:   Procedure Laterality Date    NO PAST SURGERIES       Social History     Socioeconomic History    Marital status: Married     Spouse name: Not on file    Number of children: 2    Years of education: Not  on file    Highest education level: Not on file   Occupational History    Occupation: small business owner     Comment: custom tumblers, clothing   Tobacco Use    Smoking status: Some Days     Types: Cigarettes, Electronic Cigarette (Smokeless)    Smokeless tobacco: Current    Tobacco comments:     former cigarettes, vapes currently   Substance and Sexual Activity    Alcohol use: Yes    Drug use: Never    Sexual activity: Yes     Partners: Male     Comment: married   Other Topics Concern    Not on file   Social History Narrative    Not on file     Social Determinants of Health     Financial Resource Strain: Not on file   Food Insecurity: Not on file   Transportation Needs: Not on file   Physical Activity: Insufficiently Active (04/18/2022)    Exercise is Medicine ([facility])     Days of Exercise per Week: Not on file     Minutes of Exercise per Session: Not on file   Stress: Not on file     Social Connections: Not on file   Intimate Partner Violence: Not on file   Housing Stability: Not on file     Current Outpatient Medications   Medication Sig Dispense Refill    fluticasone propionate (FLONASE) 50 MCG/ACT nasal spray Spray 1 spray into each nostril daily. 16 g 2    gabapentin (NEURONTIN) 300 MG capsule TAKE 1 CAPSULE (300 MG) BY MOUTH 3 TIMES DAILY AS NEEDED (PAIN). 90 capsule 3    gabapentin (NEURONTIN) 300 MG capsule Take 1 capsule (300 mg) by mouth nightly. 90 capsule 3    LORazepam (ATIVAN) 0.5 MG tablet Take 1 tablet (0.5 mg) by mouth once as needed for Anxiety (prior to MRI) for up to 1 dose. 2 tablet 0    LORazepam (ATIVAN) 1 MG tablet Take 1 tablet (1 mg) by mouth every 20 minutes as needed for Anxiety (/claustrophobia with MRI examination). Place under the tongue and let melt (do not swallow). Must have a driver for the MRI appointment. May repeat x 1 as needed. 2 tablet 0    naproxen (NAPROSYN) 375 MG tablet TAKE 1 TABLET BY MOUTH TWICE A DAY WITH MEALS 60 tablet 0    rizatriptan (MAXALT) 10 MG tablet Take 1  tablet (10 mg) by mouth once as needed for Migraine for up to 1 dose. May repeat in 2 hours in needed 10 tablet 2    triamcinolone (KENALOG) 0.1 % cream Apply 1 Application. topically 2 times daily. Apply a thin layer as directed 1 each 0     No current facility-administered medications for this visit.     Allergies   Allergen Reactions    Shellfish-Derived Products Swelling     Family History   Problem Relation Name Age of Onset    Bipolar Disorder Mother      Diabetes Mother      Heart Disease Mother      Alcohol/Drug Mother      Breast Cancer Mother           Objective:    BP 114/75 (BP Location: Right arm, BP Patient Position: Sitting, BP cuff size: Large)   Pulse 76   Temp 98.4 F (36.9 C) (Temporal Artery)   Ht 5' 3" (1.6 m)   Wt 90.6 kg (199 lb 11.8 oz)   LMP  (LMP Unknown)   BMI 35.38 kg/m         04/18/2022    10:41 AM 01/25/2022     8:46 AM 01/19/2022     3:24 PM 12/07/2020     8:56 AM 02/29/2020    10:43 AM 02/18/2020     4:37 PM   Date Weight Recorded   Metric 90.6 kg 92.08 kg 92.4 kg 77.111 kg 85.73 kg 86.183 kg   Pounds/Ounces 199 lb 11.8 oz 203 lb 203 lb 11.3 oz 170 lb 189 lb 190 lb          General Appearance: healthy, alert, no distress, pleasant affect, cooperative.  Heart:  normal rate and regular rhythm, no murmurs, clicks, or gallops.  Lungs: normal respiratory rate and rhythm, lungs clear to auscultation.  Abdomen: Abdomen soft, non-tender. No masses or organomegaly. Bowel sounds normal, negative Murphy's sign.  Back Exam: TTP to right low back but negative CVAT. Negative rash or lesions.  Skin negative for bruising and rash.    Lab Results   Component Value Date    BUN 11 04/18/2022    CREAT 0.88 04/18/2022    CL 105 04/18/2022      NA 138 04/18/2022    K 4.1 04/18/2022    CA 9.8 04/18/2022    TBILI 0.40 04/18/2022    ALB 4.3 04/18/2022    TP 8.0 04/18/2022    AST 14 04/18/2022    ALK 68 04/18/2022    BICARB 23 04/18/2022    ALT 12 04/18/2022    GLU 93 04/18/2022     Lab Results   Component  Value Date    WBC 11.4 (H) 04/18/2022    RBC 4.92 04/18/2022    HGB 14.4 04/18/2022    HCT 41.7 04/18/2022    MCV 84.8 04/18/2022    MCHC 34.5 04/18/2022    RDW 12.4 04/18/2022    PLT 346 04/18/2022    MPV 10.8 04/18/2022     Lipase 28    Lab Results   Component Value Date    COLORUA Yellow 04/18/2022    APPEARUA Clear 04/18/2022    GLUCOSEUA Negative 04/18/2022    BILIUA Negative 04/18/2022    KETONEUA Negative 04/18/2022    SGUA 1.032 (H) 04/18/2022    BLOODUA Negative 04/18/2022    PHUA 6.0 04/18/2022    PROTEINUA 1+ (A) 04/18/2022    UROBILUA Negative 04/18/2022    NITRITEUA Negative 04/18/2022    LEUKESTUA 25 (A) 04/18/2022    WBCUA 3-5 (A) 04/18/2022    RBCUA 0-2 04/18/2022         Angelica Wade was seen today for back pain.    Diagnoses and all orders for this visit:    Right flank pain  35 y/o female with BMI of 35, chronic LBP, and GERD who presents today with R mid back pain x 2 months that occurs only with eating greasy foods. Associated nausea. One episode of diarrhea and vomiting that occurred four days ago. Denies urinary symptoms including hx of kidney stones. Fhx of cholecystitis.   Exam is positive for right mid back pain with palpitation but negative CVAT and negative ABD tenderness including Murphy's sign.    Suspect pathology is chololithiasis considering timing of symptoms with fatty foods but differentials include GERD, nephrolithiasis vs other. Will obtain labs today (CMP and CBC ordered by Dr. Chang so will defer duplicate order but will merge results into my notes for today). ABD US to further assess source of pain. Consider CT urogram/ABD if w/u is negative and s/sx persist.   -     Urinalysis with Culture Reflex, when indicated; Future  -     US Abdomen Complete; Future    Nausea and vomiting, unspecified vomiting type  Only occurs with fatty foods. Has not occurred x 4 days since avoiding fatty foods. Continue to avoid exacerbating foods and will complete w/u as indicated above. Add H pylori  stool antigen considering reported hx of GERD. Screened for pregnancy considering age and + sexually active (nexplanon in place so less likely pregnant) and result negative.   -     Urine Pregnancy Test (POCT)  -     Lipase, Blood Green Plasma Separator Tube; Future  -     Helicobacter pylori Antigen, Stool; Future    Diarrhea, unspecified type  One episode four days ago. Advised if reoccurs then collect and submit stool tests.   -     Clostridioides (Clostridium) difficile Toxin PCR, Stool Liquid Stool in Sterile Container; Future  -     Gastrointestinal Pathogen Nucleic Acid Test; Future          Plan discussed with patient's parent including risks/benefits/alternatives including   watchful waiting.  Informed pt of 24/7 on call MD.  ED if acutely worsening after hours.  Pt verbalized understanding.    Barriers to Learning assessed: none. Patient verbalizes understanding of teaching and instructions.    See follow up        FNP-C    Cc: Galloway, Samuel J

## 2022-04-18 NOTE — Interdisciplinary (Unsigned)
Pre-visit chart review and huddle completed with staff and physician.    Outstanding labs, imaging and consults reviewed and identified.    Health maintanence issues identified and addressed:    Health Maintenance   Topic Date Due    Pneumococcal Vaccine (1 - PCV) Never done    Hepatitis C Screening  Never done    Universal HIV Screening  Never done    Lipid Screening  Never done    COVID-19 Vaccine (3 - Pfizer series) 03/17/2020    Cervical Cancer Screening  09/03/2020    Influenza (1) 07/04/2022    PHQ2 depression screen  12/27/2022    Tetanus (3 - Td or Tdap) 03/03/2028    Polio Vaccine  Aged Out    IMM_Hep A Vaccine Series  Aged Out    HPV Vaccine <= 26 Yrs  Aged Out    Meningococcal MCV4 Vaccine  Aged Out

## 2022-04-18 NOTE — Interdisciplinary (Signed)
Blood drawn from RIGHT arm with 21 gauge needle. 3 tubes taken.   Patient identity authenticated by [facility]

## 2022-04-19 ENCOUNTER — Other Ambulatory Visit (INDEPENDENT_AMBULATORY_CARE_PROVIDER_SITE_OTHER): Payer: TRICARE Prime—HMO

## 2022-04-19 ENCOUNTER — Encounter (INDEPENDENT_AMBULATORY_CARE_PROVIDER_SITE_OTHER): Payer: Self-pay | Admitting: Nurse Practitioner

## 2022-04-19 LAB — C.DIFFICILE TOXIN PCR, STOOL: C.Difficile Toxin, PCR: NOT DETECTED

## 2022-04-19 NOTE — Telephone Encounter (Signed)
From: [name]  To: Ellin Mayhew, NP  Sent: 04/19/2022 6:04 AM PDT  Subject: Question regarding URINALYSIS WITH CULTURE REFLEX, WHEN INDICATED    So what does this mean ?? I do have an infection or I don't have a infection. I did go to [employer] after I left the office just because I had to work after and about 40 or so mins later I had the diarrhea and now currently have the side pain it's about a 7 . So not sure what's going on .

## 2022-04-19 NOTE — Telephone Encounter (Signed)
Please advise 

## 2022-04-20 ENCOUNTER — Encounter (INDEPENDENT_AMBULATORY_CARE_PROVIDER_SITE_OTHER): Payer: Self-pay | Admitting: Nurse Practitioner

## 2022-04-20 LAB — URINE CULTURE

## 2022-04-20 LAB — H. PYLORI ANTIGEN: H. Pylori Antigen: NOT DETECTED

## 2022-04-23 ENCOUNTER — Ambulatory Visit
Admission: RE | Admit: 2022-04-23 | Discharge: 2022-04-23 | Disposition: A | Discharge status: Home / Self Care | Payer: TRICARE Prime—HMO | Attending: Nurse Practitioner | Admitting: Nurse Practitioner

## 2022-04-23 ENCOUNTER — Encounter (INDEPENDENT_AMBULATORY_CARE_PROVIDER_SITE_OTHER): Payer: Self-pay | Admitting: Nurse Practitioner

## 2022-04-23 DIAGNOSIS — K760 Fatty (change of) liver, not elsewhere classified: Secondary | ICD-10-CM

## 2022-04-23 DIAGNOSIS — R109 Unspecified abdominal pain: Secondary | ICD-10-CM | POA: Insufficient documentation

## 2022-04-23 NOTE — Telephone Encounter (Signed)
Ultrasound scheduled today. Will send message to patient after ultrasound resulted.    Ellin Mayhew NP

## 2022-04-24 ENCOUNTER — Telehealth (HOSPITAL_BASED_OUTPATIENT_CLINIC_OR_DEPARTMENT_OTHER): Payer: Self-pay

## 2022-04-24 ENCOUNTER — Encounter (INDEPENDENT_AMBULATORY_CARE_PROVIDER_SITE_OTHER): Payer: Self-pay | Admitting: Nurse Practitioner

## 2022-04-24 DIAGNOSIS — M549 Dorsalgia, unspecified: Secondary | ICD-10-CM

## 2022-04-24 DIAGNOSIS — K760 Fatty (change of) liver, not elsewhere classified: Secondary | ICD-10-CM

## 2022-04-24 LAB — HLA B27 TYPING

## 2022-04-24 NOTE — Telephone Encounter (Signed)
Please schedule with Dr. Ajmera, Dr. Dave, or Dr. Tincopa first available.

## 2022-04-24 NOTE — Telephone Encounter (Signed)
From: [name]  To: Ellin Mayhew, NP  Sent: 04/24/2022 6:48 AM PDT  Subject: Question regarding Complete Abdominal Ultrasound    What does this mean and why am I having this pain . I've been sleeping on the couch not able to bend or pick things up always in pain . I have even been staying away from fatty foods and so on . I've been drinking lots of water no juice no soda nothing but water . I'm so confused.

## 2022-04-24 NOTE — Telephone Encounter (Signed)
Routing to Orlean Patten, NP for review    Urgent Criteria: None    Referral: Routine Internal  Referring Provider: Rolley Sims, NP    Referring Department: [facility]   Referral Indication: Fatty liver   Established patient: No  Outreach patient: No    36 year old female with hepatic steatosis.    Race/ethnicity: Not Hispanic, Latino(a), or Spanish origin descent,race of white and ethnicity of additional ethnic background details don't apply  Metabolic disease: None  BMI: 35.38  Medication history: None  EtOH use history: Yes  Study Exclusions: No study exclusions identified    FIB-4 Calculation: 0.4 at 04/18/2022 11:26 AM  Calculated from:  SGOT/AST: 14 U/L at 04/18/2022 11:26 AM  SGPT/ALT: 12 U/L at 04/18/2022 11:26 AM  Platelets: 346 1000/mm3 at 04/18/2022 11:26 AM  Age: 31 years    Relevant Last Note:  04/18/22 visit with Rolley Sims, NP    Subjective:   Charlisa Cham is a(n) 36 year old female with a past medical history noted below who presents for :     Right mid back pain x 2 months.   Radiates to right lateral abdomen  Intermittent and occurs with greasy foods (ate tacos three nights ago when symptoms last occurred).  Diarrhea on Sunday morning. One episode. Vomited about two hours after she had diarrhea. Denies further occurrences.  Denies nausea at this time.   Some fatigue.   Hx of GERD but this feels different.   Fhx of kidney stones and gallstones with removal of gallbladder. Denies personal history.  LMP 2 months has nexplanon in place.   Hx of chronic LBP and hip pain but this feels different. F/b Dr. Radene Knee from physical medicine and rehab department. Gabapentin for chronic pain but still felt pain four nights ago    36 y/o female with BMI of 35, chronic LBP, and GERD who presents today with R mid back pain x 2 months that occurs only with eating greasy foods. Associated nausea. One episode of diarrhea and vomiting that occurred four days ago. Denies urinary symptoms including hx of  kidney stones. Fhx of cholecystitis.   Exam is positive for right mid back pain with palpitation but negative CVAT and negative ABD tenderness including Murphy's sign.    Suspect pathology is chololithiasis considering timing of symptoms with fatty foods but differentials include GERD, nephrolithiasis vs other. Will obtain labs today (CMP and CBC ordered by Dr. Radene Knee so will defer duplicate order but will merge results into my notes for today). ABD [place] to further assess source of pain. Consider CT urogram/ABD if w/u is negative and s/sx persist.   -     Urinalysis with Culture Reflex, when indicated; Future  -     US Abdomen Complete; Future     Nausea and vomiting, unspecified vomiting type  Only occurs with fatty foods. Has not occurred x 4 days since avoiding fatty foods. Continue to avoid exacerbating foods and will complete w/u as indicated above. Add H pylori stool antigen considering reported hx of GERD. Screened for pregnancy considering age and + sexually active (nexplanon in place so less likely pregnant) and result negative.   -     Urine Pregnancy Test (POCT)  -     Lipase, Blood Green Plasma Separator Tube; Future  -     Helicobacter pylori Antigen, Stool; Future     Diarrhea, unspecified type  One episode four days ago. Advised if reoccurs then collect and submit stool tests.   -  Clostridioides (Clostridium) difficile Toxin PCR, Stool Liquid Stool in Sterile Container; Future  -     Gastrointestinal Pathogen Nucleic Acid Test; Future      04/23/22  [place] Abd Complete Non Contrast (Hilmar-[place]):  Indication: right flank pain that radiates to right lateral abdomen. occurs when she eats greasy food.   LIVER:  15.9 cm in long axis.  Increase in echogenicity. Region of focal fatty sparing near the gallbladder fossa.  BILIARY: Common bile duct measures 0.3 cm.  No intra or extra hepatic bile duct dilation.  IMPRESSION:  Hepatic steatosis.  No cholelithiasis or evidence of acute cholecystitis.    Procedures: None at  time of review    Fatty Liver:  CMP  Lab Results   Component Value Date    GLU 93 04/18/2022    BUN 11 04/18/2022    CREAT 0.88 04/18/2022    NA 138 04/18/2022    K 4.1 04/18/2022    CL 105 04/18/2022    BICARB 23 04/18/2022    ANION 10 04/18/2022    Sunset 9.8 04/18/2022    TP 8.0 04/18/2022    ALB 4.3 04/18/2022    TBILI 0.40 04/18/2022    AST 14 04/18/2022    ALT 12 04/18/2022    ALK 68 04/18/2022     CBC  Lab Results   Component Value Date    WBC 11.4 (H) 04/18/2022    RBC 4.92 04/18/2022    HGB 14.4 04/18/2022    HCT 41.7 04/18/2022    MCV 84.8 04/18/2022    MCH 29.3 04/18/2022    MCHC 34.5 04/18/2022    RDW 12.4 04/18/2022    MPV 10.8 04/18/2022    PLT 346 04/18/2022    SEG 67 04/18/2022    LYMPHS 26 04/18/2022    MONOS 6 04/18/2022    EOS 1 04/18/2022    BASOS 0 04/18/2022    ABSNEUTRO 7.6 (H) 04/18/2022    ABSLYMPHS 3.0 04/18/2022    ABSMONOS 0.6 04/18/2022    ABSBASOS 0.1 04/18/2022    DIFFTYPE Automated 04/18/2022     INR  No results found for: INR  A1C  No results found for: A1C  Lipid Panel  No results found for: CHOL, HDL, LDLCALC, NHDLV, TRIG  TSH  No results found for: TSH  AMA  No results found for: AMA  ANA  No results found for: ANA  IgG  No results found for: IGG  F-actin  No results found for: FACNG  ASMA  No results found for: ASMA  Hep A   No results found for: HEPAAB  No components found for: HEPAIGM  Hep B   No results found for: HEPBSURFACAG  No results found for: HBCAT  Hep C   No results found for: HEPATITISCAB  No results found for: HEPCRNA  HIV  No results found for: HIV1TO2AB  Ceruloplasmin  No results found for: CERU  A1AT  No results found for: A1A  Ferritin  No results found for: FERRITIN  GGT  No results found for: GGT  Iron  No results found for: IRON  TIBC  No results found for: TIBC  UIBC  No results found for: UIBC  Iron Saturation  No results found for: IRONSAT  Hemochromatosis  No results found for: MHFE

## 2022-05-02 ENCOUNTER — Encounter (HOSPITAL_COMMUNITY): Payer: Self-pay

## 2022-05-02 NOTE — Telephone Encounter (Signed)
Received a call from patient calling requesting to schedule new hep appt. Scheduled pt for 09/14/2021 10AM Dr. Theodoro Grist [facility] NP letter mailed.

## 2022-05-13 ENCOUNTER — Other Ambulatory Visit (INDEPENDENT_AMBULATORY_CARE_PROVIDER_SITE_OTHER): Payer: Self-pay | Admitting: Sports Medicine

## 2022-05-13 DIAGNOSIS — G43109 Migraine with aura, not intractable, without status migrainosus: Secondary | ICD-10-CM

## 2022-05-14 NOTE — Telephone Encounter (Signed)
[facility]     Rizatriptan (Maxalt) is not currently included in the Pharmacy Refill Clinic protocols. Re-routing to the responsible staff for processing.  Thank you

## 2022-05-15 MED ORDER — RIZATRIPTAN BENZOATE 10 MG OR TABS
ORAL_TABLET | ORAL | 2 refills | Status: DC
Start: 2022-05-15 — End: 2022-08-20

## 2022-05-15 NOTE — Telephone Encounter (Signed)
Incoming fax refill request from pharmacy  Medication requested:   Requested Prescriptions     Pending Prescriptions Disp Refills    rizatriptan (MAXALT) 10 MG tablet [Pharmacy Med Name: RIZATRIPTAN 10 MG TABLET] 10 tablet 2     Sig: TAKE 1 TABLET BY MOUTH ONCE AS NEEDED FOR MIGRAINE FOR UP TO 1 DOSE. MAY REPEAT IN 2 HOURS IN NEEDED       Last refill: 01/31/2022  Pharmacy:   [facility] [address] [place] - [address]  [address] [place] [address]  Phone: [phone] Fax: [phone]    Last visit in this department 04/18/2022  Next visit in this department Visit date not found    Last date of urine immunoassay drug screen       Last date of CURES report                        Prescription refill pended and routed to PCP for review/approval if appropriate.

## 2022-05-28 ENCOUNTER — Telehealth (INDEPENDENT_AMBULATORY_CARE_PROVIDER_SITE_OTHER): Payer: Self-pay | Admitting: Sports Medicine

## 2022-05-28 NOTE — Telephone Encounter (Signed)
Who is calling: Incoming call from patient  Insurance Coverage Verified: Active- in network  Reason for this call:  Patient is calling to request a referral for Rheumatology.Per patient Dr.Chang asked her to reach out to Dr.Galloway for it.Patient states she already had a referral but it closed.Patient is asking if she needs to come in to see him if it can be this week because Dr.Galloway soonest appointment 10/2.Patient is trying to see Rheumatology this week and she already spoke to the office which they do have appointments.    Action required by office: Please contact caller     Duplicate encounter? No previous documentation found on this issue.     Best way to contact: [phone]    Inquiry has been read verbatim to this caller. Verbalizes satisfaction and confirms the above is accurate: no      Has been advised this message will be transmitted to office and can expect a response within the next 24-72 hours.

## 2022-05-28 NOTE — Telephone Encounter (Signed)
Appt needed last seen by Dr. Adah Perl 2/22 referred to Rheumatology in March 2023 by Sports Medicine Dr. Radene Knee. Called patient no answer lmsg to Missael Ferrari back to arrange appt with pcp

## 2022-05-28 NOTE — Telephone Encounter (Signed)
Patient returning phone call. Placed Patient on hold to retrieve info. Upon returning to Patient, no answer.

## 2022-06-01 NOTE — Patient Instructions (Incomplete)
Please stop by the front desk/Check-out and make your PAP/physical/WWE (well women's exam)  if not done at today's visit :)

## 2022-06-04 ENCOUNTER — Encounter (HOSPITAL_BASED_OUTPATIENT_CLINIC_OR_DEPARTMENT_OTHER): Payer: Self-pay | Admitting: Hospital

## 2022-06-04 ENCOUNTER — Encounter (INDEPENDENT_AMBULATORY_CARE_PROVIDER_SITE_OTHER): Payer: Self-pay | Admitting: Sports Medicine

## 2022-06-04 ENCOUNTER — Ambulatory Visit (INDEPENDENT_AMBULATORY_CARE_PROVIDER_SITE_OTHER): Payer: TRICARE Prime—HMO | Admitting: Sports Medicine

## 2022-06-04 VITALS — BP 116/75 | HR 76 | Temp 97.6°F | Wt 199.8 lb

## 2022-06-04 DIAGNOSIS — Z1231 Encounter for screening mammogram for malignant neoplasm of breast: Secondary | ICD-10-CM

## 2022-06-04 DIAGNOSIS — G8929 Other chronic pain: Secondary | ICD-10-CM

## 2022-06-04 DIAGNOSIS — Z23 Encounter for immunization: Secondary | ICD-10-CM

## 2022-06-04 DIAGNOSIS — M461 Sacroiliitis, not elsewhere classified: Secondary | ICD-10-CM

## 2022-06-04 DIAGNOSIS — Z1589 Genetic susceptibility to other disease: Secondary | ICD-10-CM | POA: Insufficient documentation

## 2022-06-04 DIAGNOSIS — M5441 Lumbago with sciatica, right side: Secondary | ICD-10-CM

## 2022-06-04 DIAGNOSIS — Z803 Family history of malignant neoplasm of breast: Secondary | ICD-10-CM

## 2022-06-04 NOTE — Progress Notes (Signed)
Family Medicine Clinic Note    Chief Complaint   Patient presents with    Referral/authorization     Rheumatology-Back issues  -discuss gabapentin     History of Present Illness      Angelica Wade is a 36 year old female who presents to clinic for:    #Rheumatology referral:  Has been followed by Dr. Radene Knee with ortho spine  Recommended based on her bilateral sacroiliitis to be evaluated by rheumatology  Can't do MRI due to intolerance  Had labs done  Continues to have right flank pain  Worse when lifting anything  Takes gabapentin 300-600 mg nightly  Symptoms more right sided    Reports mom was diagnosed with breast cancer at age 53    Review of Systems     As per HPI, otherwise ***    Past History     Current Outpatient Medications   Medication Sig Dispense Refill    fluticasone propionate (FLONASE) 50 MCG/ACT nasal spray Spray 1 spray into each nostril daily. 16 g 2    gabapentin (NEURONTIN) 300 MG capsule TAKE 1 CAPSULE (300 MG) BY MOUTH 3 TIMES DAILY AS NEEDED (PAIN). 90 capsule 3    gabapentin (NEURONTIN) 300 MG capsule Take 1 capsule (300 mg) by mouth nightly. 90 capsule 3    naproxen (NAPROSYN) 375 MG tablet TAKE 1 TABLET BY MOUTH TWICE A DAY WITH MEALS 60 tablet 0    rizatriptan (MAXALT) 10 MG tablet TAKE 1 TABLET BY MOUTH ONCE AS NEEDED FOR MIGRAINE FOR UP TO 1 DOSE. MAY REPEAT IN 2 HOURS IN NEEDED 10 tablet 2    triamcinolone (KENALOG) 0.1 % cream Apply 1 Application. topically 2 times daily. Apply a thin layer as directed 1 each 0     No current facility-administered medications for this visit.     Allergies   Allergen Reactions    Shellfish-Derived Products Swelling     Patient Active Problem List   Diagnosis    Unspecified mood (affective) disorder (CMS-HCC)    Chronic low back pain    Allergic rhinitis    Insomnia    Anxiety    Nexplanon in place    Adequate exercise, at or above goal    Bilateral sacroiliitis (CMS-HCC)    HLA B27 (HLA B27 positive)     Past Medical History:   Diagnosis Date     Allergic rhinitis     Bipolar affective disorder (CMS-HCC)     Chronic low back pain      Past Surgical History:   Procedure Laterality Date    NO PAST SURGERIES       Social History     Socioeconomic History    Marital status: Married    Number of children: 2   Occupational History    Occupation: Technical sales engineer     Comment: custom tumblers, clothing   Tobacco Use    Smoking status: Some Days     Types: Cigarettes, Electronic Cigarette (Smokeless)    Smokeless tobacco: Current    Tobacco comments:     former cigarettes, vapes currently   Substance and Sexual Activity    Alcohol use: Yes    Drug use: Never    Sexual activity: Yes     Partners: Male     Comment: married        Physical Exam     BP 116/75 (BP Location: Left arm, BP Patient Position: Sitting, BP cuff size: Regular)   Pulse 76  Temp 97.6 F (36.4 C) (Temporal)   Wt 90.6 kg (199 lb 12.8 oz)   LMP  (LMP Unknown)   SpO2 98%   BMI 35.39 kg/m  Body mass index is 35.39 kg/m.    Gen: Alert, no acute distress, non-toxic appearing  HEENT: NC/AT, PERRL. Oropharynx w/out exudates or erythema. Moist mucous membranes. Bilateral ear canal and TM's normal. Nares patent, no discharge.  Neck: Supple, no lymphadenopathy, no thyromegaly.  Lungs: Clear to auscultation bilaterally. No wheeze/rales/rhonchi   CV: Regular rate & rhythm, normal S1, S2. No murmurs  Abdomen: Soft, non-tender, non-distended. Normal bowel sounds. No masses, organomegaly.   Extremities: No cyanosis, erythema or edema.   Neurologic: A&O. Mentation appropriate. CN II-XII grossly normal. Normal strength and coordination in bilateral upper and lower extremities, sensation intact to light touch.   ***    Data Review (personally reviewed)     Lab Studies:  Results for orders placed or performed in visit on 04/18/22   Lipase, Blood Green Plasma Separator Tube   Result Value Ref Range    Lipase 28 13 - 60 U/L   RF (Rheumatoid Factor) - See Instructions   Result Value Ref Range    RF <10 0 -  13 [IU]/mL   C-Reactive Protein, Blood Green Plasma Separator Tube   Result Value Ref Range    CRP 0.96 (H) <0.5 mg/dL   Sedimentation Rate (ESR), Blood Lavender   Result Value Ref Range    Sed Rate 21 (H) 0 - 20 mm/hr   HLA B27 Typing Yellow ACD   Result Value Ref Range    Test Date 04/24/2022     HLA-B*27 PRESENT     Comment       The HLA-B*27 genotype is associated with risk for multiple rheumatologic   diseases. HLA-B*27 is present in approximately 90% of patients with ankylosing   spondylitis (Reveille, J.D. Clin Rheumatol. 2014. 16:109-604), as well as in   high frequencies of patients with Reiter syndrome and subtypes of juvenile   rheumatoid arthritis (Hersh, A.O. and S. Prahalad. J Autoimmun. 2015.   54:098-119). The presence of HLA-B*27 by itself is not diagnostic for these   diseases. Approximately 8% of the population in the [place] carries the HLA-B*27   genotype.      Methodology       Methodology  Genomic DNA isolated from peripheral blood was amplified using group specific   primers for HLA gene alleles. Luminex based technology is used to detect   amplicon binding to allele-specific oligonucleotide probes (SSOP). The HLA   alleles are assigned based on the reaction patterns compared to patterns   associated with published HLA gene sequences. Low-resolution HLA genotypes are   assigned at the 1-field or 2-field level sufficient to assign HLA types at   the serologic split antigen level. When present, 2-field genotypes represent   the most likely allele among alternative genotypes identified by the testing   method.  Compliance  The performance characteristics of these HLA tests were defined and validated   by the [facility] ([place] ID #[id number], ASHI #02-6-CA-12-1, CLIA #[id number]) under the   accreditation guidelines of the American Society of Histocompatibility and   Immunogenetics (ASHI). They have not be en cleared or approved by the FDA.      CBC w/ Diff Lavender   Result Value Ref Range    WBC 11.4 (H) 4.0 - 10.0 1000/mm3    RBC 4.92  3.90 - 5.20 mill/mm3    Hgb 14.4 11.2 - 15.7 gm/dL    Hct 41.7 34.0 - 45.0 %    MCV 84.8 79.0 - 95.0 um3    MCH 29.3 26.0 - 32.0 pgm    MCHC 34.5 32.0 - 36.0 g/dL    RDW 12.4 12.0 - 14.0 %    MPV 10.8 9.4 - 12.4 fL    Plt Count 346 140 - 370 1000/mm3    Segs 67 %    Lymphocytes 26 %    Monocytes 6 %    Eosinophils 1 %    Basophils 0 %    ANC-Automated 7.6 (H) 1.6 - 7.0 1000/mm3    Abs Lymphs 3.0 0.8 - 3.1 1000/mm3    Abs Monos 0.6 0.2 - 0.8 1000/mm3    Abs Eosinophils 0.1 0.0 - 0.5 1000/mm3    Abs Basophils 0.1 <0.2 1000/mm3    Diff Type Automated    Comprehensive Metabolic Panel (CMP)   Result Value Ref Range    Glucose 93 70 - 99 mg/dL    BUN 11 6 - 20 mg/dL    Creatinine 0.88 0.51 - 0.95 mg/dL    eGFR Based on CKD-EPI 2021 Equation >60 mL/min/1.73 m2    Sodium 138 136 - 145 mmol/L    Potassium 4.1 3.5 - 5.1 mmol/L    Chloride 105 98 - 107 mmol/L    Bicarbonate 23 22 - 29 mmol/L    Anion Gap 10 7 - 15 mmol/L    Calcium 9.8 8.5 - 10.6 mg/dL    Total Protein 8.0 6.0 - 8.0 g/dL    Albumin 4.3 3.5 - 5.2 g/dL    Bilirubin, Tot 0.40 <1.2 mg/dL    AST (SGOT) 14 0 - 32 U/L    ALT (SGPT) 12 0 - 33 U/L    Alkaline Phos 68 40 - 130 U/L   Urinalysis with Culture Reflex, when indicated    Specimen: Urine   Result Value Ref Range    Type Voided     Color Yellow Yellow    Appearance Clear Clear    Specific Gravity 1.032 (H) 1.002 - 1.030    pH 6.0 5.0 - 8.0    Protein 1+ (A) Negative    Glucose Negative Negative    Ketones Negative Negative    Bilirubin Negative Negative    Blood Negative Negative    Urobilinogen Negative Negative    Nitrite Negative Negative    Leuk Esterase 25 (A) Negative Leu/uL    WBC 3-5 (A) 0-2/HPF    RBC 0-2 0-2/HPF    Bacteria Rare None-Rare/HPF    Squam. Epithelial Cell 6-10(FEW) (A) <6-10(FEW)    Mucus Many (A) None-Rare/HPF   Helicobacter pylori Antigen, Stool   Result Value Ref Range    H. Pylori Antigen  Not Detected Not Detected   Gastrointestinal Pathogen Nucleic Acid Test   Result Value Ref Range    Campylobacter Group PCR, Stool Not Detected Not Detected    Plesiomonas shigelloides PCR, Stool Not Detected Not Detected    Salmonella Species PCR, Stool Not Detected Not Detected    Vibrio Group PCR, Stool Not Detected Not Detected    Vibrio cholerae PCR, Stool Not Detected Not Detected    Yersinia enterocolitica PCR, Stool Not Detected Not Detected    Enteroaggregative E. coli (EAEC) PCR, Stool Not Detected Not Detected    Enteropathogenic E. coli (EPEC) PCR, Stool Not Detected Not Detected    Enterotoxigenic E.  coli (ETEC) PCR, Stool Not Detected Not Detected    Shiga-like toxin-producing E. coli (STEC) PCR, Stool Not Detected Not Detected    Shigella species/EIEC PCR, Stool Not Detected Not Detected    Cryptosporidium species PCR, Stool Not Detected Not Detected    Cyclospora cayetanensis PCR, Stool Not Detected Not Detected    Entamoeba histolytica PCR, Stool Not Detected Not Detected    Giardia lamblia PCR, Stool Not Detected Not Detected    Adenovirus F (40/41) PCR, Stool Not Detected Not Detected    Astrovirus PCR, Stool Not Detected Not Detected    Norovirus GI/GII PCR, Stool Not Detected Not Detected    Rotavirus A PCR, Stool Not Detected Not Detected    Sapovirus (I, II, IV, and V) PCR, Stool Not Detected Not Detected   Clostridioides (Clostridium) difficile Toxin PCR, Stool Liquid Stool in Sterile Container    Specimen: Stool   Result Value Ref Range    C.Difficile Toxin, PCR Not Detected Not Detected   Urine Culture Urine    Specimen: Urine   Result Value Ref Range    Urine Culture Result Mixed Urogenital Flora        Imaging:           Assessment/Plan     Angelica Wade was seen today for referral/authorization.    Diagnoses and all orders for this visit:    Bilateral sacroiliitis (CMS-HCC)  -     Cancel: Consult/Referral to Rheumatology  -     Cancel: Consult/Referral to Rheumatology  -     Consult/Referral to  Rheumatology    HLA B27 (HLA B27 positive)  -     Consult/Referral to Rheumatology    Chronic bilateral low back pain with sciatica, sciatica laterality unspecified  -     Consult/Referral to Rheumatology    Need for vaccination  -     Flu Vaccine (Fluarix), IM, 6+ mo    Encounter for screening mammogram for breast cancer  -     Screening Mammogram With Digital Breast Tomosynthesis - Bilateral; Future    Family history of breast cancer  -     Screening Mammogram With Digital Breast Tomosynthesis - Bilateral; Future        Follow up  Return if symptoms worsen or fail to improve.    Medication Review:  Medications reviewed with patient and medication list reconciled.  Over the counter medications, herbal therapies and supplements reviewed.  Patient's understanding and response to medications assessed.   Barriers to medications assessed and addressed.   Risks, benefits, alternatives to medications reviewed.    No barriers to learning, verbalizes understanding of teaching and instructions.      Nigel Sloop, MD  [facility]  Pager [phone]

## 2022-06-04 NOTE — Interdisciplinary (Unsigned)
Pre-visit chart review and huddle completed with staff and physician.    Outstanding labs, imaging and consults reviewed and identified.    Health maintanence issues identified and addressed:    Health Maintenance   Topic Date Due    Pneumococcal Vaccine (1 - PCV) Never done    Hepatitis C Screening  Never done    Universal HIV Screening  Never done    Lipid Screening  Never done    COVID-19 Vaccine (3 - Pfizer series) 03/17/2020    Cervical Cancer Screening  09/03/2020    Influenza (1) 04/03/2022    PHQ2 depression screen  12/27/2022    Tetanus (3 - Td or Tdap) 03/03/2028    Polio Vaccine  Aged Out    IMM_Hep A Vaccine Series  Aged Out    HPV Vaccine <= 26 Yrs  Aged Out    Meningococcal MCV4 Vaccine  Aged Out

## 2022-06-05 ENCOUNTER — Encounter (INDEPENDENT_AMBULATORY_CARE_PROVIDER_SITE_OTHER): Payer: Self-pay | Admitting: Sports Medicine

## 2022-06-06 ENCOUNTER — Telehealth (INDEPENDENT_AMBULATORY_CARE_PROVIDER_SITE_OTHER): Payer: Self-pay

## 2022-06-06 NOTE — Telephone Encounter (Addendum)
Pt called requesting to schedule STAT referral for RHEUM. Pt states she was called by [facility]  Referral has no auth. Agent informed Pt of payor guidelines: 17 business days out for authorization procurement.    Offered to schedule as self-pay if wanting to schedule first available appt with no auth. Pt declined, questioning why did clinic call to schedule if there is no auth.   Please assist, is clinic approving to schedule with no auth. Thank you     Ph. [phone]

## 2022-06-07 NOTE — Telephone Encounter (Signed)
Thank you :)

## 2022-06-25 ENCOUNTER — Telehealth (INDEPENDENT_AMBULATORY_CARE_PROVIDER_SITE_OTHER): Payer: Self-pay | Admitting: Internal Medicine

## 2022-06-25 NOTE — Telephone Encounter (Signed)
NPQ

## 2022-07-09 ENCOUNTER — Ambulatory Visit (INDEPENDENT_AMBULATORY_CARE_PROVIDER_SITE_OTHER): Payer: TRICARE Prime—HMO | Admitting: Internal Medicine

## 2022-07-09 VITALS — BP 134/85 | HR 76 | Temp 97.6°F | Ht 63.0 in | Wt 199.0 lb

## 2022-07-09 DIAGNOSIS — M461 Sacroiliitis, not elsewhere classified: Secondary | ICD-10-CM

## 2022-07-09 DIAGNOSIS — L409 Psoriasis, unspecified: Secondary | ICD-10-CM

## 2022-07-09 MED ORDER — DICLOFENAC SODIUM 75 MG OR TBEC
75.0000 mg | DELAYED_RELEASE_TABLET | Freq: Two times a day (BID) | ORAL | 3 refills | Status: DC
Start: 2022-07-09 — End: 2022-10-08

## 2022-07-09 MED ORDER — DICLOFENAC SODIUM 1 % EX GEL
2.0000 g | Freq: Four times a day (QID) | CUTANEOUS | 3 refills | Status: AC
Start: 2022-07-09 — End: ?

## 2022-07-09 NOTE — Progress Notes (Signed)
PATIENT: Angelica Wade  MRN: [id number]  DOB: 08/01/1986  DATE OF SERVICE:     TITLE: Outpatient Rheumatology Consultation    Referring Physician: Edythe Clarity  PMD: Edythe Clarity    Reason for Consult:     HPI: Pearline Yerby is a 36 year old female who was referred for AS. 37yrh/o back pain. Started 1 year before her son's birth. Husband in the [employer] In 2019, had episodes where she was in bed due to sig bilat hip pain. C/o right lower back pain radiating to RUQ x 2.54month unable to do MRI due to severe anxiety. Didn't tolerate ativan/valium due to n/v.     Worse pain at am and night time. Sig spasm of spine x yrs. Has to sleep on reclining couch due to back pain. Worse pain with lying flat and on her side. Some relief w/ gabapentin 90050mMorning pain not necessarily better with ambulation. Takes up to 1hr to get out of bed. Sig pain at right mid back affecting her sleep. Pain is better with standing up overall. But sometimes worse with sitting down. Stretches and exercises but doesn't help w/ her pain. No uveitis. No n/v/abd pain/rash. +dry eyes. No dry mouth. Wears contact lens. No alopecia. Recent weight gain 20 lb over few yrs. No psorisis; +bad dandruff. Some right elbow pain      ROS: 14-point ROS was negative other than as noted above.    PMH:  Astigmatism  Some cornea issues?  G2P2 (4 a35d 7 y79 old; son w/ mild ADHD/autistic)  No h/o DVT/PE     Past Medical History:   Diagnosis Date    Allergic rhinitis     Bipolar affective disorder (CMS-HCC)     Chronic low back pain      Past Surgical History:   Procedure Laterality Date    NO PAST SURGERIES         Allergies:   Allergies   Allergen Reactions    Shellfish-Derived Products Swelling       Medications:   Gabapentin 900m56ms  alleve 440mg1mily -> no GI issues    Outpatient Medications Marked as Taking for the 07/09/22 encounter (Office Visit) with Kaysen Sefcik, Alger Memos  Medication Sig Dispense Refill    fluticasone propionate (FLONASE) 50  MCG/ACT nasal spray Spray 1 spray into each nostril daily. 16 g 2    gabapentin (NEURONTIN) 300 MG capsule TAKE 1 CAPSULE (300 MG) BY MOUTH 3 TIMES DAILY AS NEEDED (PAIN). 90 capsule 3    gabapentin (NEURONTIN) 300 MG capsule Take 1 capsule (300 mg) by mouth nightly. 90 capsule 3    rizatriptan (MAXALT) 10 MG tablet TAKE 1 TABLET BY MOUTH ONCE AS NEEDED FOR MIGRAINE FOR UP TO 1 DOSE. MAY REPEAT IN 2 HOURS IN NEEDED 10 tablet 2       FHx: No known autoimmune disease.  family history includes Alcohol/Drug in her mother; Bipolar Disorder in her mother; Breast Cancer (age of onset: 40) i63her mother; Diabetes in her mother; Heart Disease in her mother.    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 She uses smokeless tobacco. She reports current alcohol use. She reports that she does not use drugs.    Physical Exam:   Vital Signs:   BP 134/85   Pulse 76  Temp 97.6 F (36.4 C) (Temporal)   Ht _0  (1.6 m)   Wt 90.3 kg (199 lb)   LMP  (LMP Unknown)   SpO2 96%   BMI 35.25 kg/m     GEN: NAD.  HEENT: PERRL, EOMI, OP clear, no ulcers.  Neck: Supple, no LAD.  CV: RRR, ?1/6 SEM at RUSB  Chest: CTAB.  Abd: S/NT/ND.  Ext: No C/C/E.   MSK: No active synovitis. +ttp mid/lower back and bilat SIJ; Schober's 10-> 14.5cm; occiupt to wall 0cm; right lateral epicondyle ttp  _1 A 28 joint count was performed including all 4 extremities. Except where noted, FROM, no tenderness or swelling; normal stability, strength.   Skin: sig dandruff; few dry red patches in her scalp and below right ear  _2 Except where noted, inspection and palpation of all 4 extremities revealed skin warm and intact without presence of rash.   Neuro: A+O x 4. Strength and sensation grossly intact throughout.    Labs: _3 reviewed _4 ordered.  Lab Results   Component  Value Date    WBC 11.4 (H) 04/18/2022    HGB 14.4 04/18/2022    HCT 41.7 04/18/2022    PLT 346 04/18/2022     Lab Results   Component Value Date    CREAT 0.88 04/18/2022    AST 14 04/18/2022    ALT 12 04/18/2022     No components found for: "PROTCLUR", "BLDUR", "LEUKESTUR", "RBCSUR", "WBCSUR"  Lab Results   Component Value Date    CRP 0.96 (H) 04/18/2022     No results found for: "C3", "C4", "DRVVT", "SMAB", "SSA", "CANCA", "PANCA", "MPOAB"    04/18/22 bun/cr 11/0.9, nl lft, crp 0.96, esr 21, u/a 1+prot, wbc 11.4 h/h 14.4/42 plt 346, +HLA B27, neg RF  07/12/22 bun/cr 11/0.86, nl lft, esr 18, crp 0.5, wbc 13.4 h/h 13.8/41 plt 317, neg hep B/C/QTN TB    Studies: _5 reviewed _6 ordered _7 independently reviewed.  02/2020 LS spine x-ray  FINDINGS:  Lumbar spine 3 images   There is no fracture, dislocation or acute abnormality. Spinal alignment is normal. The vertebral bodies, disc spaces and posterior elements are normal. No abnormality of the paraspinal soft tissues is present.    IMPRESSION:  Normal lumbar spine.      11/2021 pelvis x-ray  FINDINGS:  There is no acute fracture or malalignment.   The sacroiliac joints, pubic symphysis, and hips are congruent. Compared to the lumbar spine radiograph 02/17/2020, mildly increased sclerosis at the sacroiliac joints with ill-defined margins, suggesting erosions.   Bilateral hips are unremarkable without significant degenerative change.  The soft tissues are unremarkable.     IMPRESSION:  Increased sclerosis of the sacroiliac joints, with ill-defined margins suggesting erosions and raising concern for sacroiliitis. Recommend further evaluation with dedicated MRI of the sacroiliac joints.   Unremarkable radiographic appearance of the bilateral hips.    04/23/22 abd u/s  IMPRESSION:  Hepatic steatosis.  No cholelithiasis or evidence of acute cholecystitis.         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 Some  inflammatory component although sig mechanical component as well. ?erosive changes on pelvis x-ray 11/2021.  +dandruff which raises for possible PsA. Pt unable to tolerate MRI due to claustrophobia. Pt will think about whether she would like to go through MRI w/ sedation if no improvement w/ voltaren. Pt also w/ right lateral epicondylitis      -derm referral for rash and to assess  for PsA  -unable to do PT due to time constraint but rec to do at least 1-2 sessions to learn home exercises  -trial of voltaren 4m bid  -consider CT pelvis vs MRI pelvis w/ IV sedation if sx persist despite nsaids  -consider TNF-I   -right elbow strap/ice  -voltaren gel tid  -rec regular vaccines in prep for biologics  -rtc in 2 months    The above plan of care, diagnosis, orders, and follow-up were discussed with the patient. Questions related to this recommended plan of care were answered.     Thank you for referring this patient to me. Please feel free to contact me with any questions.     60 min spent on patient care/record review       SMyrene Buddy M.D.

## 2022-07-09 NOTE — Patient Instructions (Addendum)
Read about humira and enbrel under ankylosing spondylitis treatment on [facility].     Stop alleve and start voltaren 75mg  2x/day with food    For elbow, look up exercises for tennis elbow. Wear elbow strap and ice elbow and use topical gel 3x/day    Call dermatology for appt for psoriasis    Talk to your PCP for all vaccines: COVID, shingles, pneumovax

## 2022-07-12 ENCOUNTER — Other Ambulatory Visit: Payer: TRICARE Prime—HMO | Attending: Internal Medicine

## 2022-07-12 DIAGNOSIS — M461 Sacroiliitis, not elsewhere classified: Secondary | ICD-10-CM | POA: Insufficient documentation

## 2022-07-12 LAB — CBC WITH DIFF, BLOOD
ANC-Automated: 9.3 10*3/uL — ABNORMAL HIGH (ref 1.6–7.0)
Abs Basophils: 0.1 10*3/uL (ref <0.2)
Abs Eosinophils: 0.1 10*3/uL (ref 0.0–0.5)
Abs Lymphs: 3.3 10*3/uL — ABNORMAL HIGH (ref 0.8–3.1)
Abs Monos: 0.5 10*3/uL (ref 0.2–0.8)
Basophils: 1 %
Eosinophils: 1 %
Hct: 41.3 % (ref 34.0–45.0)
Hgb: 13.8 gm/dL (ref 11.2–15.7)
Imm Gran Abs: 0.1 10*3/uL — ABNORMAL HIGH (ref <0.1)
Lymphocytes: 25 %
MCH: 28.9 pg (ref 26.0–32.0)
MCHC: 33.4 g/dL (ref 32.0–36.0)
MCV: 86.4 um3 (ref 79.0–95.0)
MPV: 11.3 fL (ref 9.4–12.4)
Monocytes: 4 %
Plt Count: 317 10*3/uL (ref 140–370)
RBC: 4.78 10*6/uL (ref 3.90–5.20)
RDW: 12.6 % (ref 12.0–14.0)
Segs: 70 %
WBC: 13.4 10*3/uL — ABNORMAL HIGH (ref 4.0–10.0)

## 2022-07-12 LAB — COMPREHENSIVE METABOLIC PANEL, BLOOD
ALT (SGPT): 16 U/L (ref 0–33)
AST (SGOT): 14 U/L (ref 0–32)
Albumin: 4.2 g/dL (ref 3.5–5.2)
Alkaline Phos: 60 U/L (ref 40–130)
Anion Gap: 12 mmol/L (ref 7–15)
BUN: 11 mg/dL (ref 6–20)
Bicarbonate: 24 mmol/L (ref 22–29)
Bilirubin, Tot: 0.19 mg/dL (ref <1.2)
Calcium: 9.3 mg/dL (ref 8.5–10.6)
Chloride: 103 mmol/L (ref 98–107)
Creatinine: 0.86 mg/dL (ref 0.51–0.95)
Glucose: 108 mg/dL — ABNORMAL HIGH (ref 70–99)
Potassium: 3.4 mmol/L — ABNORMAL LOW (ref 3.5–5.1)
Sodium: 139 mmol/L (ref 136–145)
Total Protein: 7.4 g/dL (ref 6.0–8.0)
eGFR Based on CKD-EPI 2021 Equation: >60 mL/min/{1.73_m2}

## 2022-07-12 LAB — SED RATE, BLOOD: Sed Rate: 18 mm/hr (ref 0–20)

## 2022-07-12 LAB — HEPATITIS B CORE AB TOTAL: HBcAb Total: NONREACTIVE

## 2022-07-12 LAB — HEPATITIS C AB, BLOOD: Hepatitis C Ab: NONREACTIVE

## 2022-07-12 LAB — HEPATITIS B SURFACE AG, BLOOD: HBsAg: NONREACTIVE

## 2022-07-12 LAB — C-REACTIVE PROTEIN, BLOOD: CRP: 0.5 mg/dL — ABNORMAL HIGH (ref <0.5)

## 2022-07-12 LAB — HEPATITIS B SURFACE AB, QUANT, BLOOD: HBsAb,Qt: 924.6 m[IU]/mL

## 2022-07-12 NOTE — Interdisciplinary (Signed)
Blood drawn from right arm with 21 gauge needle. 7 tubes taken.   Patient identity authenticated by Msl

## 2022-07-16 LAB — QUANTIFERON-TB, BLOOD
Result: NEGATIVE
TB1 Minus Nil: 0 [IU]/mL
TB2 minus Nil: 0 [IU]/mL

## 2022-07-22 ENCOUNTER — Other Ambulatory Visit (INDEPENDENT_AMBULATORY_CARE_PROVIDER_SITE_OTHER): Payer: Self-pay | Admitting: Sports Medicine

## 2022-07-22 DIAGNOSIS — G8929 Other chronic pain: Secondary | ICD-10-CM

## 2022-07-23 ENCOUNTER — Other Ambulatory Visit (INDEPENDENT_AMBULATORY_CARE_PROVIDER_SITE_OTHER): Payer: Self-pay | Admitting: Sports Medicine

## 2022-07-23 DIAGNOSIS — G8929 Other chronic pain: Secondary | ICD-10-CM

## 2022-07-23 NOTE — Telephone Encounter (Unsigned)
Patient is requesting refill via MyChart  Medication requested:   Requested Prescriptions     Pending Prescriptions Disp Refills    gabapentin (NEURONTIN) 300 MG capsule [Pharmacy Med Name: GABAPENTIN 300 MG CAPSULE] 90 capsule 3     Sig: TAKE 1 CAPSULE (300 MG) BY MOUTH 3 TIMES DAILY AS NEEDED (PAIN).       Last refill: 10/27/2020  Pharmacy:   [facility] [address] [place] - [address] [facility] [address]  [address] [place] [address]  Phone: [phone] Fax: [phone]    Last visit in this department 06/04/2022  Next visit in this department Visit date not found    Last date of urine immunoassay drug screen       Last date of CURES report                        Prescription refill pended and routed to PCP for review/approval if appropriate.

## 2022-07-23 NOTE — Telephone Encounter (Signed)
[facility]     gabapentin  is not currently included in the Pharmacy Refill Clinic protocols. Re-routing to the responsible staff for processing.  Thank you.

## 2022-07-23 NOTE — Telephone Encounter (Signed)
neurontin is not currently included in the Pharmacy Refill Clinic protocols. Re-routing to the responsible staff for processing.  Thank you

## 2022-07-24 MED ORDER — GABAPENTIN 300 MG OR CAPS
300.0000 mg | ORAL_CAPSULE | Freq: Three times a day (TID) | ORAL | 0 refills | Status: AC | PRN
Start: 2022-07-24 — End: ?

## 2022-07-24 MED ORDER — GABAPENTIN 300 MG OR CAPS
300.00 mg | ORAL_CAPSULE | Freq: Three times a day (TID) | ORAL | 0 refills | Status: DC | PRN
Start: 2022-07-24 — End: 2022-09-18

## 2022-07-24 NOTE — Telephone Encounter (Signed)
Duplicate. Refilled.    Ellin Mayhew NP

## 2022-07-24 NOTE — Telephone Encounter (Signed)
Covering for primary care provider. Temporary Prescription refilled.    Martino Tompson NP

## 2022-07-24 NOTE — Telephone Encounter (Signed)
Incoming call from patient requesting refill  Medication requested:   Requested Prescriptions     Pending Prescriptions Disp Refills    gabapentin (NEURONTIN) 300 MG capsule 90 capsule 3     Sig: Take 1 capsule (300 mg) by mouth 3 times daily as needed (pain).       Last refill: 03/22/2022  Pharmacy:   [facility] [address] [place] - [address] [facility] [address]  [address] [address]  Phone: [phone] Fax: [phone]    Last visit in this department 06/04/2022  Next visit in this department Visit date not found    Last date of urine immunoassay drug screen       Last date of CURES report                        Prescription refill pended and routed to PCP for review/approval if appropriate.

## 2022-07-24 NOTE — Telephone Encounter (Signed)
CALL BACK    Pt calling back to request rx to be expedited per patient stating is completely out.     Please Expedited     Action required by office: Please process request     Best way to contact: [phone]    Encounter created by Care Assist MA.  If further action required please route encounter to appropriate in clinic MA/LVN/Resident Pool

## 2022-08-18 ENCOUNTER — Other Ambulatory Visit (INDEPENDENT_AMBULATORY_CARE_PROVIDER_SITE_OTHER): Payer: Self-pay | Admitting: Sports Medicine

## 2022-08-18 DIAGNOSIS — G43109 Migraine with aura, not intractable, without status migrainosus: Secondary | ICD-10-CM

## 2022-08-19 NOTE — Telephone Encounter (Signed)
maxalt is not currently included in the Pharmacy Refill Clinic protocols. Re-routing to the responsible staff for processing.  Thank you

## 2022-08-20 MED ORDER — RIZATRIPTAN BENZOATE 10 MG OR TABS
ORAL_TABLET | ORAL | 2 refills | Status: DC
Start: 2022-08-20 — End: 2022-10-15

## 2022-08-20 NOTE — Telephone Encounter (Signed)
Incoming fax refill request from pharmacy  Medication requested:   Requested Prescriptions     Pending Prescriptions Disp Refills    rizatriptan (MAXALT) 10 MG tablet [Pharmacy Med Name: RIZATRIPTAN 10 MG TABLET] 10 tablet 2     Sig: TAKE 1 TAB BY MOUTH ONCE AS NEEDED FOR MIGRANE HEADACHE FOR UP TO 1 DOSE MAY REPEAT IN 2 HOURS AS NEEDED       Last refill: 05/15/2022  Pharmacy:   [facility] [address] [place] - [address]  [address] [place] [address]  Phone: [phone] Fax: [phone]    Last visit in this department 06/04/2022  Next visit in this department Visit date not found    Last date of urine immunoassay drug screen       Last date of CURES report                        Prescription refill pended and routed to PCP for review/approval if appropriate.

## 2022-08-31 ENCOUNTER — Telehealth (INDEPENDENT_AMBULATORY_CARE_PROVIDER_SITE_OTHER): Payer: Self-pay | Admitting: Gastroenterology

## 2022-08-31 NOTE — Telephone Encounter (Signed)
Dr. Theodoro Grist had a change in schedule and will not have clinic on Friday 09/14/2022.    Spoke with patient and stated she would like to just cancel appt instead of rescheduling as everything is up in the air and not sure if husband will be getting stationed somewhere else. Declined to reschedule anything at the moment.

## 2022-09-14 ENCOUNTER — Encounter (INDEPENDENT_AMBULATORY_CARE_PROVIDER_SITE_OTHER): Payer: TRICARE Prime—HMO | Admitting: Gastroenterology

## 2022-09-17 ENCOUNTER — Other Ambulatory Visit (INDEPENDENT_AMBULATORY_CARE_PROVIDER_SITE_OTHER): Payer: Self-pay | Admitting: Nurse Practitioner

## 2022-09-17 DIAGNOSIS — G8929 Other chronic pain: Secondary | ICD-10-CM

## 2022-09-18 MED ORDER — GABAPENTIN 300 MG OR CAPS
300.00 mg | ORAL_CAPSULE | Freq: Three times a day (TID) | ORAL | 0 refills | Status: DC | PRN
Start: 2022-09-18 — End: 2022-10-15

## 2022-09-18 NOTE — Telephone Encounter (Signed)
[facility]     Gabapentin is not currently included in the Pharmacy Refill Clinic protocols. Re-routing to the responsible staff for processing.  Thank you

## 2022-09-18 NOTE — Telephone Encounter (Signed)
Patient is requesting refill via MyChart  Medication requested:   Requested Prescriptions     Pending Prescriptions Disp Refills    gabapentin (NEURONTIN) 300 MG capsule [Pharmacy Med Name: GABAPENTIN 300 MG CAPSULE] 90 capsule 0     Sig: TAKE 1 CAPSULE (300 MG) BY MOUTH 3 TIMES DAILY AS NEEDED (PAIN).       Last refill: 07/24/2022  Pharmacy:   [id number] - [place], [address]  [place] [address]  Phone: [phone] Fax: [phone]    Last visit in this department 06/04/2022  Next visit in this department Visit date not found    Last date of urine immunoassay drug screen       Last date of CURES report                        Prescription refill pended and routed to PCP for review/approval if appropriate.

## 2022-10-01 ENCOUNTER — Telehealth (INDEPENDENT_AMBULATORY_CARE_PROVIDER_SITE_OTHER): Payer: Self-pay | Admitting: Internal Medicine

## 2022-10-01 ENCOUNTER — Encounter (INDEPENDENT_AMBULATORY_CARE_PROVIDER_SITE_OTHER): Payer: Self-pay | Admitting: Internal Medicine

## 2022-10-01 ENCOUNTER — Other Ambulatory Visit: Payer: Self-pay

## 2022-10-01 ENCOUNTER — Ambulatory Visit (INDEPENDENT_AMBULATORY_CARE_PROVIDER_SITE_OTHER): Payer: TRICARE Prime—HMO | Admitting: Internal Medicine

## 2022-10-01 VITALS — BP 130/78 | HR 86 | Temp 97.1°F | Ht 63.0 in | Wt 197.6 lb

## 2022-10-01 DIAGNOSIS — M461 Sacroiliitis, not elsewhere classified: Secondary | ICD-10-CM

## 2022-10-01 DIAGNOSIS — M47819 Spondylosis without myelopathy or radiculopathy, site unspecified: Secondary | ICD-10-CM

## 2022-10-01 MED ORDER — ADALIMUMAB 40 MG/0.4ML SC PNKT
40.00 mg | PEN_INJECTOR | SUBCUTANEOUS | 3 refills | Status: DC
Start: 2022-10-01 — End: 2022-10-05
  Filled 2022-10-01: qty 2, 28d supply, fill #0

## 2022-10-01 NOTE — Progress Notes (Signed)
HPI: Angelica Wade is a 37 year old female who was referred for AS. 28yr h/o back pain. Started 1 year before her son's birth. Husband in the [employer]. In 2019, had episodes where she was in bed due to sig bilat hip pain. C/o right lower back pain radiating to RUQ x 2.56months. unable to do MRI due to severe anxiety. Didn't tolerate ativan/valium due to n/v.   Worse pain at am and night time. Sig spasm of spine x yrs. Has to sleep on reclining couch due to back pain. Worse pain with lying flat and on her side. Some relief w/ gabapentin 900mg . Morning pain not necessarily better with ambulation. Takes up to 1hr to get out of bed. Sig pain at right mid back affecting her sleep. Pain is better with standing up overall. But sometimes worse with sitting down. Stretches and exercises but doesn't help w/ her pain. No uveitis. No n/v/abd pain/rash. +dry eyes. No dry mouth. Wears contact lens. No alopecia. Recent weight gain 20 lb over few yrs. No psorisis; +bad dandruff. Some right elbow pain    INTERVAL HISTORY  1st seen 07/2022 for back pain and outside dx of AS. Over the last 3 days, c/o sig pain at bilat hip and lower back. Hasn't had time to go to derm. Husband is in [employer] school. Still has rash behind right ear, flaky. Slightly pruitic. Most likely will move to [place] (temporarily to [place] to be w/ family) for 1 month and will move again to another location where her husband will relocate to. Husband graduates end of 10/2022.      ROS: 14-point ROS was negative other than as noted above.    PMH:  Astigmatism  Some cornea issues?  G2P2 (3 and 76 yr old; son w/ mild ADHD/autistic)  No h/o DVT/PE     Past Medical History:   Diagnosis Date    Allergic rhinitis     Bipolar affective disorder (CMS-HCC)     Chronic low back pain      Past Surgical History:   Procedure Laterality Date    NO PAST SURGERIES         Allergies:   Allergies   Allergen Reactions    Shellfish-Derived Products Swelling       Medications:   H/o  alleve 440mg  qdaily -> no GI issues    Gabapentin 900mg  qhs  Diclofenac 75mg  bid       FHx: No known autoimmune disease.  family history includes Alcohol/Drug in her mother; Bipolar Disorder in her mother; Breast Cancer (age of onset: 56) in her mother; Diabetes in her mother; Heart Disease in her mother.    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 She uses smokeless tobacco. She reports current alcohol use. She reports that she does not use drugs.    Physical Exam:   Vital Signs:   BP 130/78   Pulse 86   Temp 97.1 F (36.2 C) (Temporal)   Ht 5\' 3"  (1.6 m)   Wt 89.6 kg (197 lb 9.6 oz)   SpO2 93%   BMI 35.00 kg/m     GEN: NAD.  HEENT: PERRL, EOMI, OP clear, no ulcers.  Neck: Supple, no LAD.  CV: RRR, ?1/6 SEM at right upper sternal border  Chest: CTAB.  Abd: S/NT/ND.  Ext: No C/C/E.  MSK: No active synovitis. +ttp mid/lower back and bilat SIJ; Schober's 10-> 14.5cm; occiupt to wall 0cm; right lateral epicondyle ttp  [x] A 28 joint count was performed including all 4 extremities. Except where noted, FROM, no tenderness or swelling; normal stability, strength.   Skin: +dandruff; few dry red patches in her scalp and below right ear  [x] Except where noted, inspection and palpation of all 4 extremities revealed skin warm and intact without presence of rash.   Neuro: Strength and sensation grossly intact throughout.    Labs: [] reviewed [] ordered.  Lab Results   Component Value Date    WBC 13.4 (H) 07/12/2022    HGB 13.8 07/12/2022    HCT 41.3 07/12/2022    PLT 317 07/12/2022     Lab Results   Component Value Date    CREAT 0.86 07/12/2022    AST 14 07/12/2022    ALT 16 07/12/2022     No components found for: "PROTCLUR", "BLDUR", "LEUKESTUR", "RBCSUR", "WBCSUR"  Lab Results   Component Value Date    CRP 0.50 (H) 07/12/2022     CRP 0.96 (H) 04/18/2022     No results found for: "C3", "C4", "DRVVT", "SMAB", "SSA", "CANCA", "PANCA", "MPOAB"    04/18/22 bun/cr 11/0.9, nl lft, crp 0.96, esr 21, u/a 1+prot, wbc 11.4 h/h 14.4/42 plt 346, +HLA B27, neg RF  07/12/22 bun/cr 11/0.86, nl lft, esr 18, crp 0.5, wbc 13.4 h/h 13.8/41 plt 317, neg hep B/C/QTN TB    Studies: [] reviewed [] ordered [] independently reviewed.  02/2020 LS spine x-ray  There is no fracture, dislocation or acute abnormality. Spinal alignment is normal. The vertebral bodies, disc spaces and posterior elements are normal. No abnormality of the paraspinal soft tissues is present.  IMPRESSION:  Normal lumbar spine.    11/2021 pelvis x-ray  FINDINGS:  There is no acute fracture or malalignment.   The sacroiliac joints, pubic symphysis, and hips are congruent. Compared to the lumbar spine radiograph 02/17/2020, mildly increased sclerosis at the sacroiliac joints with ill-defined margins, suggesting erosions.   Bilateral hips are unremarkable without significant degenerative change.  The soft tissues are unremarkable.  IMPRESSION:  Increased sclerosis of the sacroiliac joints, with ill-defined margins suggesting erosions and raising concern for sacroiliitis. Recommend further evaluation with dedicated MRI of the sacroiliac joints.   Unremarkable radiographic appearance of the bilateral hips.    04/23/22 abd u/s  IMPRESSION:  Hepatic steatosis.  No cholelithiasis or evidence of acute cholecystitis.       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 Some inflammatory component although sig mechanical component as well. ?erosive changes on pelvis x-ray 11/2021.  +dandruff which raises for possible PsA. Pt unable to tolerate MRI due to severe claustrophobia. Pt also w/ right lateral epicondylitis. Overall, high suspicion for SpA (either HLA B27+ SpA vs PsA)    -start humira 40mg   sq q2wks   -rtc in 4-6 weeks (before leaving for ) w/ repeat labs  -derm referral for rash and to assess for PsA, still pending appt  -unable to do PT due to time constraint but rec to do at least 1-2 sessions to learn home exercises  -cont voltaren 75mg  bid  -considered CT pelvis vs MRI pelvis w/ IV sedation but pt declines  -voltaren gel tid for elbow    The above plan of care, diagnosis, orders, and follow-up were discussed with the patient. Questions related  to this recommended plan of care were answered.    99min spent on patient care/record review       Myrene Buddy, M.D.

## 2022-10-01 NOTE — Telephone Encounter (Signed)
Pt walked back in to clinic stating if she can take Voltaren and gabapentin together, pt also had questions regarding humira. Pt will be waiting for a call back. Please advise.Thank you

## 2022-10-01 NOTE — Patient Instructions (Signed)
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     Start humira 40mg  injection every 2 weeks    Call as soon as you know your move date

## 2022-10-02 ENCOUNTER — Telehealth (INDEPENDENT_AMBULATORY_CARE_PROVIDER_SITE_OTHER): Payer: Self-pay | Admitting: Internal Medicine

## 2022-10-02 ENCOUNTER — Other Ambulatory Visit: Payer: Self-pay

## 2022-10-02 DIAGNOSIS — M461 Sacroiliitis, not elsewhere classified: Secondary | ICD-10-CM

## 2022-10-02 NOTE — Telephone Encounter (Signed)
[facility] Initial Prior Authorization Request      Received notification that patient requires prior authorization for Humira Pen 40mg /0.62ml every 14 days #2/28days      Insurance ID: [id number]       PLAN:  --Submitted PA to [facility] via ePA

## 2022-10-03 ENCOUNTER — Encounter (INDEPENDENT_AMBULATORY_CARE_PROVIDER_SITE_OTHER): Payer: Self-pay | Admitting: Internal Medicine

## 2022-10-03 DIAGNOSIS — M461 Sacroiliitis, not elsewhere classified: Secondary | ICD-10-CM

## 2022-10-03 NOTE — Telephone Encounter (Signed)
She can take humira, voltaren, and gabapentin together.

## 2022-10-03 NOTE — Telephone Encounter (Signed)
Patient requesting pharmacy change per insurance requirements . I have added the new pharmacy to pended order.

## 2022-10-03 NOTE — Telephone Encounter (Signed)
From: Cheron Schaumann  To: Alger Memos, MD  Sent: 10/03/2022 10:46 AM PST  Subject: Prescription    Hi [facility] and I requested that it be sent there so they can just send it to me automatically no matter where I am at . They said you would have to accept it and then they could get started . Thank you for being so patient.

## 2022-10-04 NOTE — Telephone Encounter (Signed)
See MyChart encounter 10/01/22

## 2022-10-04 NOTE — Telephone Encounter (Signed)
Per Dr. Truman Hayward on telephone encounter 10/01/22    "She can take humira, voltaren, and gabapentin together"

## 2022-10-05 ENCOUNTER — Other Ambulatory Visit: Payer: Self-pay

## 2022-10-05 ENCOUNTER — Encounter (HOSPITAL_BASED_OUTPATIENT_CLINIC_OR_DEPARTMENT_OTHER): Payer: Self-pay | Admitting: Hospital

## 2022-10-05 ENCOUNTER — Telehealth (INDEPENDENT_AMBULATORY_CARE_PROVIDER_SITE_OTHER): Payer: Self-pay | Admitting: Internal Medicine

## 2022-10-05 DIAGNOSIS — M461 Sacroiliitis, not elsewhere classified: Secondary | ICD-10-CM

## 2022-10-05 MED ORDER — ADALIMUMAB 40 MG/0.4ML SC PNKT (CUSTOM)
40.0000 mg | PEN_INJECTOR | SUBCUTANEOUS | 3 refills | Status: DC
Start: 2022-10-05 — End: 2022-10-05

## 2022-10-05 MED ORDER — ADALIMUMAB 40 MG/0.4ML SC PNKT (CUSTOM)
40.0000 mg | PEN_INJECTOR | SUBCUTANEOUS | 1 refills | Status: AC
Start: 2022-10-05 — End: ?

## 2022-10-05 NOTE — Telephone Encounter (Signed)
PA approved for Humira but insurance restricts to [employer]. Reordered to restricted pharmacy per protocol.     Quantiferon TB Screening  Lab Results   Component Value Date    QTB1N 0.00 07/12/2022    QTB2N 0.00 07/12/2022    Q4RES Negative 07/12/2022        Hepatitis B Serologies  Lab Results   Component Value Date    HEPBSURFABQT 924.6 07/12/2022    HBCAT Non Reactive 07/12/2022    HEPBSURFACAG Non Reactive 07/12/2022        Hepatitis C Screening  Lab Results   Component Value Date    HEPATITISCAB Non Reactive 07/12/2022

## 2022-10-05 NOTE — Telephone Encounter (Signed)
Placed outbound call to [facility] [phone]) to check order status for Humira order we sent today. Patient care coordinator Carmell Austria C. could not locate account for patient but  since patient is new it could take up to 7 days to complete processing order . In the meantime she was able to confirm that [facility] carries Humira in pen form. Patient will be updated via MyChart message, thank you.

## 2022-10-05 NOTE — Addendum Note (Signed)
Addended byVernie Murders on: 10/05/2022 04:56 PM     Modules accepted: Orders

## 2022-10-05 NOTE — Telephone Encounter (Signed)
Pt called in to advise she call insurance and [facility] does not carry the Humira Pen 40mg /0.59ml even though they are a division of [facility] the RX has to go directly to [facility].  If you have any questions please contact the Patient.   Thank you.        [phone]

## 2022-10-05 NOTE — Telephone Encounter (Signed)
Please let the patient know. I've sent the script to [facility] per pharmacy note.

## 2022-10-05 NOTE — Telephone Encounter (Signed)
Placed outbound call to patient and scheduled Pharm D telephone visit for Humira (New Start) on 10/08/22 at 2 PM, please contact patient at phone number [phone]. Thank you!    FYI: Patient also requested Humira be rerouted to [facility], I will call Monday to follow up with pharmacy and confirm they received it and check status. Clinical pharmacist is aware. Thank you!

## 2022-10-05 NOTE — Telephone Encounter (Signed)
See 10/02/22 encounter - insurance restricts to [employer].

## 2022-10-05 NOTE — Telephone Encounter (Signed)
[facility] Prior Authorization Status       PA for Humira Pen 40mg /0.68ml every 14 days #2/28days     Approved    Dates: 09/02/22 to 09/02/2098    PA # or Case ID#: [id number]    Specialty pharmacy: [facility]       Note Had to call get the approval dates and will uplaod the once received        PLAN:  --Prescription needs to be entered, routed to [facility]

## 2022-10-08 ENCOUNTER — Encounter (HOSPITAL_BASED_OUTPATIENT_CLINIC_OR_DEPARTMENT_OTHER): Payer: Self-pay | Admitting: Pharmacist Clinician (PhC)/ Clinical Pharmacy Specialist

## 2022-10-08 ENCOUNTER — Encounter (INDEPENDENT_AMBULATORY_CARE_PROVIDER_SITE_OTHER): Payer: Self-pay | Admitting: Hospital

## 2022-10-08 ENCOUNTER — Other Ambulatory Visit (INDEPENDENT_AMBULATORY_CARE_PROVIDER_SITE_OTHER): Payer: Self-pay | Admitting: Internal Medicine

## 2022-10-08 ENCOUNTER — Telehealth (HOSPITAL_BASED_OUTPATIENT_CLINIC_OR_DEPARTMENT_OTHER): Payer: Self-pay | Admitting: Pharmacist Clinician (PhC)/ Clinical Pharmacy Specialist

## 2022-10-08 ENCOUNTER — Ambulatory Visit: Payer: TRICARE Prime—HMO | Admitting: Pharmacist Clinician (PhC)/ Clinical Pharmacy Specialist

## 2022-10-08 DIAGNOSIS — M461 Sacroiliitis, not elsewhere classified: Secondary | ICD-10-CM

## 2022-10-08 NOTE — Telephone Encounter (Signed)
Last visit: 10/01/2022   Next visit: Visit date not found     Requested Prescriptions     Pending Prescriptions Disp Refills    diclofenac (VOLTAREN) 75 MG DR tablet 60 tablet 3     Sig: Take 1 tablet (75 mg) by mouth 2 times daily (with meals).         LAST CBC  WBC   Date Value Ref Range Status   07/12/2022 13.4 4.0 - 10.0 1000/mm3 Final     RBC   Date Value Ref Range Status   07/12/2022 4.78 3.90 - 5.20 mill/mm3 Final     Hgb   Date Value Ref Range Status   07/12/2022 13.8 11.2 - 15.7 gm/dL Final     Hct   Date Value Ref Range Status   07/12/2022 41.3 34.0 - 45.0 % Final     MCV   Date Value Ref Range Status   07/12/2022 86.4 79.0 - 95.0 um3 Final     MCH   Date Value Ref Range Status   07/12/2022 28.9 26.0 - 32.0 pgm Final     MCHC   Date Value Ref Range Status   07/12/2022 33.4 32.0 - 36.0 g/dL Final     RDW   Date Value Ref Range Status   07/12/2022 12.6 12.0 - 14.0 % Final     MPV   Date Value Ref Range Status   07/12/2022 11.3 9.4 - 12.4 fL Final        Last CMP:   Sodium   Date Value Ref Range Status   07/12/2022 139 136 - 145 mmol/L Final     Potassium   Date Value Ref Range Status   07/12/2022 3.4 3.5 - 5.1 mmol/L Final     Chloride   Date Value Ref Range Status   07/12/2022 103 98 - 107 mmol/L Final     Bicarbonate   Date Value Ref Range Status   07/12/2022 24 22 - 29 mmol/L Final     BUN   Date Value Ref Range Status   07/12/2022 11 6 - 20 mg/dL Final     Creatinine   Date Value Ref Range Status   07/12/2022 0.86 0.51 - 0.95 mg/dL Final     Glucose   Date Value Ref Range Status   07/12/2022 108 70 - 99 mg/dL Final     Calcium   Date Value Ref Range Status   07/12/2022 9.3 8.5 - 10.6 mg/dL Final        Last ESR:   Sed Rate   Date Value Ref Range Status   07/12/2022 18 0 - 20 mm/hr Final       Forwarded to Dr. Truman Hayward, medication pending approval.

## 2022-10-08 NOTE — Progress Notes (Addendum)
Rheumatology Clinic Pharmacist Telemedicine Note      ---------------------(data below generated by Angelica Wade, Angelica Wade)--------------------    Patient Verification & Telephone Based Visit Consent:      I have personally performed a full and complete verbal consent of this patient via telephone prior to proceeding with this telephone evaluation. The verbal consent discussion included each of the elements as described in the verification below.    I am proceeding with this evaluation at the direct request of the patient.  I have verified this is the correct patient and have obtained verbal consent from the patient/ surrogate to perform this voluntary telephone visit evaluation (including obtaining history and reviewing data provided by the patient).   The patient/ surrogate has the right to refuse this evaluation.  I have explained risks (including potential loss of confidentiality), benefits, alternatives, and the potential need for subsequent face to face care. Patient/ surrogate understands that there is a risk of medical inaccuracies given that our recommendations will be made based on reported data (and we must therefore assume this information is accurate).  Knowing that there is a risk that this information is not reported accurately, and that the audio, or data feed may be incomplete, the patient agrees to proceed with evaluation and holds [place] harmless knowing these risks. In this evaluation, we will be providing recommendations only.  The patient/ surrogate has been notified that other healthcare professionals (including students, residents and Engineer, maintenance) may be involved in this audio evaluation.   All laws concerning confidentiality and patient access to medical records and copies of medical records apply. I have reviewed this above verification and consent paragraph with the patient/ surrogate.  If the patient is not capacitated to understand the above, and no surrogate is available, since  this is not an emergency evaluation, the visit will be rescheduled until such time that the patient can consent, or the surrogate is available to consent.    Demographics:   Medical Record #: [id number]   Date: October 08, 2022   Patient Name: Angelica Wade   DOB: Oct 26, 1985  Age: 37 year old  Sex: female  Location: Home address on file    Evaluator(s):   Angelica Wade was evaluated by me today.    Clinic Location: [place]  [place]  [address]   [place]      SUBJECTIVE:     CC: Angelica Wade is a 37 year old female seen today for comprehensive medication review and education for Humira.    Diagnosis: SpA  Initial diagnosis date: 09/2022  Current therapy: Humira (new start)  Last rheumatology clinic visit: 10/01/2022 Angelica Wade)    Interval history:  HPI: Angelica Wade is a 37 year old female who was referred for AS. 69yr h/o back pain. Started 1 year before her son's birth. Husband in the [place]. In 2019, had episodes where she was in bed due to sig bilat hip pain. C/o right lower back pain radiating to RUQ x 2.77months. unable to do MRI due to severe anxiety. Didn't tolerate ativan/valium due to n/v.   Worse pain at am and night time. Sig spasm of spine x yrs. Has to sleep on reclining couch due to back pain. Worse pain with lying flat and on her side. Some relief w/ gabapentin 900mg . Morning pain not necessarily better with ambulation. Takes up to 1hr to get out of bed. Sig pain at right mid back affecting her sleep. Pain  is better with standing up overall. But sometimes worse with sitting down. Stretches and exercises but doesn't help w/ her pain. No uveitis. No n/v/abd pain/rash. +dry eyes. No dry mouth. Wears contact lens. No alopecia. Recent weight gain 20 lb over few yrs. No psorisis; +bad dandruff. Some right elbow pain     INTERVAL HISTORY  1st seen 07/2022 for back pain and outside dx of AS. Over the last 3 days, c/o sig pain at bilat hip and  lower back. Hasn't had time to go to derm. Husband is in [place]. Still has rash behind right ear, flaky. Slightly pruitic. Most likely will move to [place] (temporarily to [place] to be w/ family) for 1 month and will move again to another location where her husband will relocate to. Husband graduates end of 10/2022.     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 Some inflammatory component although sig mechanical component as well. ?erosive changes on pelvis x-ray 11/2021.  +dandruff which raises for possible PsA. Pt unable to tolerate MRI due to severe claustrophobia. Pt also w/ right lateral epicondylitis. Overall, high suspicion for SpA (either HLA B27+ SpA vs PsA)     -start humira 40mg  sq q2wks   -rtc in 4-6 weeks (before leaving for ) w/ repeat labs  -derm referral for rash and to assess for PsA, still pending appt  -unable to do PT due to time constraint but rec to do at least 1-2 sessions to learn home exercises  -cont voltaren 75mg  bid  -considered CT pelvis vs MRI pelvis w/ IV sedation but pt declines  -voltaren gel tid for elbow           10/08/2022     2:01 PM   [place] AMB RAPID3 CUMULATIVE SCORE   Cumulative Score 17.33      1-3 = Near Remission, 4-6 = Low Severity, 7-12 = Moderate Severity, 13-30 = High Severity      Current Outpatient Medications:     Adalimumab 40 MG/0.4ML PNKT, Inject 0.4 mL (40 mg) under the skin every 14 days., Disp: 2.4 mL, Rfl: 1    diclofenac (VOLTAREN) 1 % gel, Apply 2 g topically 4 times daily., Disp: 1 each, Rfl: 3    diclofenac (VOLTAREN) 75 MG DR tablet, Take 1 tablet (75 mg) by mouth 2 times daily (with meals)., Disp: 60 tablet, Rfl: 3    fluticasone propionate (FLONASE) 50 MCG/ACT nasal spray, Spray 1 spray into each nostril daily., Disp: 16 g, Rfl: 2    gabapentin (NEURONTIN) 300 MG capsule, TAKE 1 CAPSULE (300 MG) BY MOUTH 3 TIMES  DAILY AS NEEDED (PAIN)., Disp: 90 capsule, Rfl: 0    gabapentin (NEURONTIN) 300 MG capsule, Take 1 capsule (300 mg) by mouth 3 times daily as needed (pain)., Disp: 90 capsule, Rfl: 0    gabapentin (NEURONTIN) 300 MG capsule, Take 1 capsule (300 mg) by mouth nightly., Disp: 90 capsule, Rfl: 3    rizatriptan (MAXALT) 10 MG tablet, TAKE 1 TAB BY MOUTH ONCE AS NEEDED FOR MIGRANE HEADACHE FOR UP TO 1 DOSE MAY REPEAT IN 2 HOURS AS NEEDED, Disp: 10 tablet, Rfl: 2    triamcinolone (KENALOG) 0.1 % cream, Apply 1 Application. topically 2 times daily. Apply a thin layer as directed, Disp: 1 each, Rfl: 0    Drug interactions identified: no    Orders/prescriptions up to date/adequate refills: yes  [place] coverage: [place]:  [facility] (per pt request)    OBJECTIVE:     Lab Results   Component Value Date    WBC 13.4 (H) 07/12/2022    RBC 4.78 07/12/2022    HGB 13.8 07/12/2022    HCT 41.3 07/12/2022    MCV 86.4 07/12/2022    PLT 317 07/12/2022      Lab Results   Component Value Date    NA 139 07/12/2022    K 3.4 (L) 07/12/2022    CREAT 0.86 07/12/2022    GLU 108 (H) 07/12/2022    [place] 9.3 07/12/2022    AST 14 07/12/2022    ALT 16 07/12/2022    ALB 4.2 07/12/2022     Lab Results   Component Value Date    CRP 0.50 (H) 07/12/2022    ESR 18 07/12/2022      Lab Results   Component Value Date    QTB1N 0.00 07/12/2022    QTB2N 0.00 07/12/2022    Q4RES Negative 07/12/2022      Lab Results   Component Value Date    HEPBSURFABQT 924.6 07/12/2022    HBCAT Non Reactive 07/12/2022    HEPBSURFACAG Non Reactive 07/12/2022      Lab Results   Component Value Date    HEPATITISCAB Non Reactive 07/12/2022      Blood Pressure   10/01/22 130/78   07/09/22 134/85   06/04/22 116/75        Drug specific safety monitoring:   TNF inhibitors: infections (including but not limited to tuberculosis, histoplasmosis, coccidiomycosis, listeriosis), drug induced lupus    Immunizations:    Current Immunizations  Never Reviewed      Name  Date    COVID-19 Therapist, music) Purple Cap >= 12 Years  01/21/2020 ,  12/31/2019    Influenza Vaccine (Unspecified)  06/15/2019    Influenza Vaccine >=6 Months  06/04/2022 ,  10/25/2017    Tdap  03/03/2018 ,  11/10/2014            ASSESSMENT AND PLAN:     Kiyra is newly started on Humira therapy.  Appropriate storage, injection sites, and injection technique reviewed in detail using demonstration pen.  Reviewed potential side effects as listed above, including but not limited to injection site reactions and the risk of infection.    No other issues identified; continue present management and follow up with PharmD in 1 year or sooner as needed.    Patient education: provided counseling on Humira indication, dosing, administration, frequency, potential adverse effects, medication alternatives, disease state management/monitoring/treatment goals, and medication adherence.   Follow up scheduled: Visit date not found - 1 month follow up requested  Labs needed: no  Vaccinations:    COVID: you should receive 2 doses 3-4 weeks apart, followed by a booster after 5 months.  A second booster is recommended 4 months after that.  Pneumonia vaccine: you can receive one dose of PCV20 (brand name Prevnar 20) OR one dose of PCV15 (brand name Vaxneuvance) followed by a dose of PPSV23 (brand name Pneumovax) at least 8 weeks later. If you receive Vaxneuvance + Pneumovax, a Pneumovax booster is needed 5 years after your first Pneumovax dose, and a third and final Pneumovax dose should be given at least 5 years after the second dose AND at age 52 or older.   Shingles vaccine: the shingles vaccine (Shingrix) is a 2 dose vaccination given 2 - 6 months apart.  Pharmacist Interventions:  Helped schedule f/up (10/08/22 1550)     Total time spent evaluating patient, reviewing chart, pertinent history, counseling patient and coordinating care was 45 minutes.       Fraser Din, PharmD, BCACP  Clinical Pharmacist, Rheumatology

## 2022-10-08 NOTE — Telephone Encounter (Signed)
Sent pt mychart msg to inform of Rx sent to [facility].

## 2022-10-08 NOTE — Telephone Encounter (Signed)
Placed outbound call to [facility] [phone]) to check order status for Humira. Patient care coordinator Daren reviewed patients profile and confirmed Humira order was received 10/05/22 and is ready to be scheduled for shipment. Daren also confirmed that plan will allow only for a 2 month supply to be dispensed at a time with a $38 copay.      Placed 2nd outbound call to patient and provided update. Patient verbalized they understood and confirmed Humira shipment is already scheduled to arrive 10/10/22.

## 2022-10-11 ENCOUNTER — Telehealth (INDEPENDENT_AMBULATORY_CARE_PROVIDER_SITE_OTHER): Payer: Self-pay | Admitting: Internal Medicine

## 2022-10-11 NOTE — Telephone Encounter (Unsigned)
LVM for patient to call and schedule follow up visit with Dr. Truman Hayward.

## 2022-10-12 ENCOUNTER — Other Ambulatory Visit (INDEPENDENT_AMBULATORY_CARE_PROVIDER_SITE_OTHER): Payer: Self-pay | Admitting: Internal Medicine

## 2022-10-12 DIAGNOSIS — M461 Sacroiliitis, not elsewhere classified: Secondary | ICD-10-CM

## 2022-10-12 MED ORDER — DICLOFENAC SODIUM 75 MG OR TBEC
75.0000 mg | DELAYED_RELEASE_TABLET | Freq: Two times a day (BID) | ORAL | 3 refills | Status: DC
Start: 2022-10-12 — End: 2022-11-20

## 2022-10-12 NOTE — Telephone Encounter (Signed)
Last visit: 10/01/2022   Next visit:     Requested Prescriptions     Pending Prescriptions Disp Refills    diclofenac (VOLTAREN) 75 MG DR tablet 60 tablet 3     Sig: Take 1 tablet (75 mg) by mouth 2 times daily (with meals).         LAST CBC  WBC   Date Value Ref Range Status   07/12/2022 13.4 4.0 - 10.0 1000/mm3 Final     RBC   Date Value Ref Range Status   07/12/2022 4.78 3.90 - 5.20 mill/mm3 Final     Hgb   Date Value Ref Range Status   07/12/2022 13.8 11.2 - 15.7 gm/dL Final     Hct   Date Value Ref Range Status   07/12/2022 41.3 34.0 - 45.0 % Final     MCV   Date Value Ref Range Status   07/12/2022 86.4 79.0 - 95.0 um3 Final     MCH   Date Value Ref Range Status   07/12/2022 28.9 26.0 - 32.0 pgm Final     MCHC   Date Value Ref Range Status   07/12/2022 33.4 32.0 - 36.0 g/dL Final     RDW   Date Value Ref Range Status   07/12/2022 12.6 12.0 - 14.0 % Final     MPV   Date Value Ref Range Status   07/12/2022 11.3 9.4 - 12.4 fL Final        Last CMP:   Sodium   Date Value Ref Range Status   07/12/2022 139 136 - 145 mmol/L Final     Potassium   Date Value Ref Range Status   07/12/2022 3.4 3.5 - 5.1 mmol/L Final     Chloride   Date Value Ref Range Status   07/12/2022 103 98 - 107 mmol/L Final     Bicarbonate   Date Value Ref Range Status   07/12/2022 24 22 - 29 mmol/L Final     BUN   Date Value Ref Range Status   07/12/2022 11 6 - 20 mg/dL Final     Creatinine   Date Value Ref Range Status   07/12/2022 0.86 0.51 - 0.95 mg/dL Final     Glucose   Date Value Ref Range Status   07/12/2022 108 70 - 99 mg/dL Final     Calcium   Date Value Ref Range Status   07/12/2022 9.3 8.5 - 10.6 mg/dL Final        Last ESR:   Sed Rate   Date Value Ref Range Status   07/12/2022 18 0 - 20 mm/hr Final       Forwarded to Dr. Truman Hayward, medication pending approval.

## 2022-10-15 ENCOUNTER — Encounter (INDEPENDENT_AMBULATORY_CARE_PROVIDER_SITE_OTHER): Payer: Self-pay | Admitting: Internal Medicine

## 2022-10-15 ENCOUNTER — Encounter (INDEPENDENT_AMBULATORY_CARE_PROVIDER_SITE_OTHER): Payer: Self-pay | Admitting: Sports Medicine

## 2022-10-15 DIAGNOSIS — M461 Sacroiliitis, not elsewhere classified: Secondary | ICD-10-CM

## 2022-10-15 DIAGNOSIS — G43109 Migraine with aura, not intractable, without status migrainosus: Secondary | ICD-10-CM

## 2022-10-15 DIAGNOSIS — G8929 Other chronic pain: Secondary | ICD-10-CM

## 2022-10-15 MED ORDER — RIZATRIPTAN BENZOATE 10 MG OR TABS
10.0000 mg | ORAL_TABLET | Freq: Once | ORAL | 3 refills | Status: AC | PRN
Start: 2022-10-15 — End: ?

## 2022-10-15 MED ORDER — GABAPENTIN 300 MG OR CAPS
300.0000 mg | ORAL_CAPSULE | Freq: Three times a day (TID) | ORAL | 1 refills | Status: AC | PRN
Start: 2022-10-15 — End: ?

## 2022-11-05 ENCOUNTER — Other Ambulatory Visit: Payer: TRICARE Prime—HMO | Attending: Internal Medicine

## 2022-11-05 DIAGNOSIS — M461 Sacroiliitis, not elsewhere classified: Secondary | ICD-10-CM | POA: Insufficient documentation

## 2022-11-05 LAB — CBC WITH DIFF, BLOOD
ANC-Automated: 6.5 10*3/uL (ref 1.6–7.0)
Abs Basophils: 0.1 10*3/uL (ref <0.2)
Abs Eosinophils: 0.2 10*3/uL (ref 0.0–0.5)
Abs Lymphs: 3.3 10*3/uL — ABNORMAL HIGH (ref 0.8–3.1)
Abs Monos: 0.9 10*3/uL — ABNORMAL HIGH (ref 0.2–0.8)
Basophils: 1 %
Eosinophils: 2 %
Hct: 41.4 % (ref 34.0–45.0)
Hgb: 14.1 gm/dL (ref 11.2–15.7)
Lymphocytes: 30 %
MCH: 29.4 pg (ref 26.0–32.0)
MCHC: 34.1 g/dL (ref 32.0–36.0)
MCV: 86.3 um3 (ref 79.0–95.0)
MPV: 11.5 fL (ref 9.4–12.4)
Monocytes: 8 %
Plt Count: 292 10*3/uL (ref 140–370)
RBC: 4.8 10*6/uL (ref 3.90–5.20)
RDW: 13 % (ref 12.0–14.0)
Segs: 59 %
WBC: 11 10*3/uL — ABNORMAL HIGH (ref 4.0–10.0)

## 2022-11-05 LAB — COMPREHENSIVE METABOLIC PANEL, BLOOD
ALT (SGPT): 24 U/L (ref 0–33)
AST (SGOT): 17 U/L (ref 0–32)
Albumin: 4.2 g/dL (ref 3.5–5.2)
Alkaline Phos: 63 U/L (ref 40–130)
Anion Gap: 10 mmol/L (ref 7–15)
BUN: 13 mg/dL (ref 6–20)
Bicarbonate: 24 mmol/L (ref 22–29)
Bilirubin, Tot: 0.19 mg/dL (ref <1.2)
Calcium: 9.3 mg/dL (ref 8.5–10.6)
Chloride: 105 mmol/L (ref 98–107)
Creatinine: 0.84 mg/dL (ref 0.51–0.95)
Glucose: 107 mg/dL — ABNORMAL HIGH (ref 70–99)
Potassium: 4.1 mmol/L (ref 3.5–5.1)
Sodium: 139 mmol/L (ref 136–145)
Total Protein: 7.6 g/dL (ref 6.0–8.0)
eGFR Based on CKD-EPI 2021 Equation: >60 mL/min/{1.73_m2}

## 2022-11-05 LAB — SED RATE, BLOOD: Sed Rate: 18 mm/hr (ref 0–20)

## 2022-11-05 LAB — C-REACTIVE PROTEIN, BLOOD: CRP: 0.76 mg/dL — ABNORMAL HIGH (ref <0.5)

## 2022-11-05 NOTE — Interdisciplinary (Signed)
Blood drawn from right arm with 21 gauge needle. 2 tubes taken.   Patient identity authenticated by Alexander Cabodil.

## 2022-11-20 ENCOUNTER — Ambulatory Visit: Payer: TRICARE Prime—HMO | Admitting: Internal Medicine

## 2022-11-20 ENCOUNTER — Encounter (HOSPITAL_BASED_OUTPATIENT_CLINIC_OR_DEPARTMENT_OTHER): Payer: Self-pay | Admitting: Rheumatology

## 2022-11-20 DIAGNOSIS — M461 Sacroiliitis, not elsewhere classified: Secondary | ICD-10-CM

## 2022-11-20 DIAGNOSIS — L409 Psoriasis, unspecified: Secondary | ICD-10-CM

## 2022-11-20 NOTE — Telephone Encounter (Signed)
error 

## 2022-11-20 NOTE — Progress Notes (Signed)
HPI: Angelica Wade is a 37 year old female who was referred for AS. 77yr h/o back pain. Started 1 year before her son's birth. Husband in the [employer]. In 2019, had episodes where she was in bed due to sig bilat hip pain. C/o right lower back pain radiating to RUQ x 2.54months. unable to do MRI due to severe anxiety. Didn't tolerate ativan/valium due to n/v.   Worse pain at am and night time. Sig spasm of spine x yrs. Has to sleep on reclining couch due to back pain. Worse pain with lying flat and on her side. Some relief w/ gabapentin 900mg . Morning pain not necessarily better with ambulation. Takes up to 1hr to get out of bed. Sig pain at right mid back affecting her sleep. Pain is better with standing up overall. But sometimes worse with sitting down. Stretches and exercises but doesn't help w/ her pain. No uveitis. No n/v/abd pain/rash. +dry eyes. No dry mouth. Wears contact lens. No alopecia. Recent weight gain 20 lb over few yrs. No psorisis; +bad dandruff. Some right elbow pain    INTERVAL HISTORY  1st seen 07/2022, last 09/2022 for AS. Over the last 3 days, c/o sig pain at bilat hip and lower back. Hasn't had time to go to derm. Husband is in [employer] school. Still has rash behind right ear, flaky. Slightly pruitic. Most likely will move to [place] (temporarily to [place] to be w/ family) for 1 month and will move again to another location where her husband will relocate to. Husband graduates end of 10/2022.    No change in back pain with humira. No help with voltaren. Some help with gabapentin for sleep. Worsening right hip pain during the day. Cont to have rash at back of right ear. Waiting to see derm in [place]. Will move to [place] 2nd week of April. No recent infections. She will not be back in [place] prior to her move    ROS: 14-point ROS was negative other than as noted above.    PMH:  Astigmatism  Some cornea issues?  G2P2 (7 and 48 yr old; son w/ mild ADHD/autistic)  No h/o DVT/PE     Past Medical  History:   Diagnosis Date    Allergic rhinitis     Bipolar affective disorder (CMS-HCC)     Chronic low back pain      Past Surgical History:   Procedure Laterality Date    NO PAST SURGERIES         Allergies:   Allergies   Allergen Reactions    Shellfish-Derived Products Swelling       Medications:   H/o alleve 440mg  qdaily -> no GI issues    Gabapentin 900mg  qhs  Diclofenac 75mg  bid -> LOE  Humira 40mg  q2wks (started 10/2022, just took 3rd shot 11/13/22)      FHx: No known autoimmune disease.  family history includes Alcohol/Drug in her mother; Bipolar Disorder in her mother; Breast Cancer (age of onset: 69) in her mother; Diabetes in her mother; Heart Disease in her mother.    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 She uses smokeless tobacco. She reports current alcohol use. She reports that she does not use drugs.    Physical Exam:   Vital Signs:   There were no vitals taken for this visit.  GEN: NAD.    At last F2F visit:  HEENT: PERRL, EOMI, OP clear, no ulcers.  Neck: Supple, no LAD.  CV: RRR, ?1/6 SEM at right upper sternal border  Chest: CTAB.  Abd: S/NT/ND.  Ext: No C/C/E.   MSK: No active synovitis. +ttp mid/lower back and bilat SIJ; Schober's 10-> 14.5cm; occiupt to wall 0cm; right lateral epicondyle ttp  [x] A 28 joint count was performed including all 4 extremities. Except where noted, FROM, no tenderness or swelling; normal stability, strength.   Skin: +dandruff; few dry red patches in her scalp and below right ear  [x] Except where noted, inspection and palpation of all 4 extremities revealed skin warm and intact without presence of rash.     Labs: [] reviewed [] ordered.  Lab Results   Component Value Date    WBC 11.0 (H) 11/05/2022    HGB 14.1 11/05/2022    HCT 41.4 11/05/2022    PLT 292 11/05/2022     Lab  Results   Component Value Date    CREAT 0.84 11/05/2022    AST 17 11/05/2022    ALT 24 11/05/2022     No components found for: "PROTCLUR", "BLDUR", "LEUKESTUR", "RBCSUR", "WBCSUR"  Lab Results   Component Value Date    CRP 0.76 (H) 11/05/2022    CRP 0.50 (H) 07/12/2022     No results found for: "C3", "C4", "DRVVT", "SMAB", "SSA", "CANCA", "PANCA", "MPOAB"    04/18/22 bun/cr 11/0.9, nl lft, crp 0.96, esr 21, u/a 1+prot, wbc 11.4 h/h 14.4/42 plt 346, +HLA B27, neg RF  07/12/22 bun/cr 11/0.86, nl lft, esr 18, crp 0.5, wbc 13.4 h/h 13.8/41 plt 317, neg hep B/C/QTN TB  11/05/22 bun/cr 13/0.8, nl lft, crp 0.76, esr 18, neg hep B/C/QTN TB, wbc 11 h/h 14/41 plt 292    Studies: [] reviewed [] ordered [] independently reviewed.  02/2020 LS spine x-ray  There is no fracture, dislocation or acute abnormality. Spinal alignment is normal. The vertebral bodies, disc spaces and posterior elements are normal. No abnormality of the paraspinal soft tissues is present.  IMPRESSION:  Normal lumbar spine.    11/2021 pelvis x-ray  FINDINGS:  There is no acute fracture or malalignment.   The sacroiliac joints, pubic symphysis, and hips are congruent. Compared to the lumbar spine radiograph 02/17/2020, mildly increased sclerosis at the sacroiliac joints with ill-defined margins, suggesting erosions.   Bilateral hips are unremarkable without significant degenerative change.  The soft tissues are unremarkable.  IMPRESSION:  Increased sclerosis of the sacroiliac joints, with ill-defined margins suggesting erosions and raising concern for sacroiliitis. Recommend further evaluation with dedicated MRI of the sacroiliac joints.   Unremarkable radiographic appearance of the bilateral hips.    04/23/22 abd u/s  IMPRESSION:  Hepatic steatosis.  No cholelithiasis or evidence of acute cholecystitis.       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 Some inflammatory component although sig mechanical component as well. ?erosive changes on pelvis x-ray 11/2021.  +dandruff which raises for possible PsA. Pt unable to tolerate MRI due to severe claustrophobia. Pt also w/ right lateral epicondylitis. Overall, high suspicion for SpA (either HLA B27+ SpA vs PsA). Started on humira (s/p 3 shots) w/ no improvement in sx. No drug AEs. Unfortunately, unable to return for further eval prior to her move    -cont humira 40mg  sq q2wks for now but if sx persists,  would rec steroid injection to right SIJ  -given component of both inflammatory/non-inflammatory component, would rec doing CT pelvis to assess for inflammation/sacroiliitis if pt remains unresponsive to humira; will need IV sedation due to severe claustrophobia if opts for MRI  -if e/o persistent inflammation/sacroiliitis, consider switch to IL-17 inhibitor  -trial of adding gapentin 300mg  in the morning and continue 900mg  qhs to see if it helps with back pain  -ibuprofen 600mg  tid  -d/c voltaren  -pt advised to f/up closely with derm and rheumatology in [place]      The above plan of care, diagnosis, orders, and follow-up were discussed with the patient. Questions related to this recommended plan of care were answered.    82min spent on patient care/record review       Myrene Buddy, M.D.

## 2022-11-20 NOTE — Patient Instructions (Signed)
Talk to new rheumatologist about possible right SI joint steroid injection  If you're still having pain with humira, consider cosentyx

## 2023-02-25 ENCOUNTER — Other Ambulatory Visit (INDEPENDENT_AMBULATORY_CARE_PROVIDER_SITE_OTHER): Payer: Self-pay | Admitting: Internal Medicine

## 2023-02-25 DIAGNOSIS — M461 Sacroiliitis, not elsewhere classified: Secondary | ICD-10-CM

## 2023-05-31 ENCOUNTER — Encounter (INDEPENDENT_AMBULATORY_CARE_PROVIDER_SITE_OTHER): Payer: Self-pay

## 2023-06-11 NOTE — Telephone Encounter (Signed)
Letter sent.

## 2023-07-26 NOTE — Telephone Encounter (Signed)
RN called pt   Moved to [place]
